# Patient Record
Sex: Female | Born: 1945 | Marital: Married
Health system: Southern US, Community
[De-identification: ages and names within clinical notes are randomized; demographics above are authoritative.]

---

## 2008-10-22 ENCOUNTER — Encounter: Payer: Self-pay | Admitting: Unknown Physician Specialty

## 2008-11-12 ENCOUNTER — Encounter: Payer: Self-pay | Admitting: Unknown Physician Specialty

## 2009-02-04 ENCOUNTER — Ambulatory Visit: Payer: Self-pay | Admitting: Internal Medicine

## 2010-12-16 ENCOUNTER — Ambulatory Visit: Payer: Self-pay

## 2011-04-21 ENCOUNTER — Ambulatory Visit: Payer: Self-pay | Admitting: Physical Medicine and Rehabilitation

## 2011-08-03 ENCOUNTER — Ambulatory Visit: Payer: Self-pay | Admitting: Internal Medicine

## 2011-10-03 ENCOUNTER — Ambulatory Visit: Payer: Self-pay | Admitting: Urology

## 2011-12-26 ENCOUNTER — Ambulatory Visit: Payer: Self-pay | Admitting: Unknown Physician Specialty

## 2012-02-12 ENCOUNTER — Inpatient Hospital Stay: Payer: Self-pay

## 2012-02-12 LAB — PROTIME-INR
INR: 1.2
Prothrombin Time: 15.8 secs — ABNORMAL HIGH (ref 11.5–14.7)

## 2012-02-12 LAB — CBC
HCT: 26.5 % — ABNORMAL LOW (ref 35.0–47.0)
HGB: 8.4 g/dL — ABNORMAL LOW (ref 12.0–16.0)
MCH: 25.1 pg — ABNORMAL LOW (ref 26.0–34.0)
MCHC: 31.8 g/dL — ABNORMAL LOW (ref 32.0–36.0)
MCV: 79 fL — ABNORMAL LOW (ref 80–100)
Platelet: 732 10*3/uL — ABNORMAL HIGH (ref 150–440)
RDW: 16.7 % — ABNORMAL HIGH (ref 11.5–14.5)

## 2012-02-12 LAB — COMPREHENSIVE METABOLIC PANEL
Albumin: 1.9 g/dL — ABNORMAL LOW (ref 3.4–5.0)
Anion Gap: 9 (ref 7–16)
BUN: 30 mg/dL — ABNORMAL HIGH (ref 7–18)
Bilirubin,Total: 2.3 mg/dL — ABNORMAL HIGH (ref 0.2–1.0)
Chloride: 91 mmol/L — ABNORMAL LOW (ref 98–107)
Co2: 24 mmol/L (ref 21–32)
Creatinine: 1.65 mg/dL — ABNORMAL HIGH (ref 0.60–1.30)
EGFR (African American): 37 — ABNORMAL LOW
EGFR (Non-African Amer.): 32 — ABNORMAL LOW
Glucose: 108 mg/dL — ABNORMAL HIGH (ref 65–99)
Osmolality: 256 (ref 275–301)
SGOT(AST): 28 U/L (ref 15–37)
SGPT (ALT): 14 U/L

## 2012-02-12 LAB — DIFFERENTIAL
Eosinophil #: 0.2 10*3/uL (ref 0.0–0.7)
Eosinophil %: 0.6 %
Lymphocyte #: 1.1 10*3/uL (ref 1.0–3.6)
Monocyte #: 1.3 x10 3/mm — ABNORMAL HIGH (ref 0.2–0.9)
Monocyte %: 5.1 %
Neutrophil %: 90 %

## 2012-02-12 LAB — IRON AND TIBC
Iron: 13 ug/dL — ABNORMAL LOW (ref 50–170)
Unbound Iron-Bind.Cap.: 157 ug/dL

## 2012-02-12 LAB — LIPASE, BLOOD: Lipase: 85 U/L (ref 73–393)

## 2012-02-12 LAB — CLOSTRIDIUM DIFFICILE BY PCR

## 2012-02-12 LAB — OCCULT BLOOD X 1 CARD TO LAB, STOOL: Occult Blood, Feces: NEGATIVE

## 2012-02-13 LAB — BASIC METABOLIC PANEL
Calcium, Total: 7.6 mg/dL — ABNORMAL LOW (ref 8.5–10.1)
Chloride: 95 mmol/L — ABNORMAL LOW (ref 98–107)
Co2: 20 mmol/L — ABNORMAL LOW (ref 21–32)
Creatinine: 1.03 mg/dL (ref 0.60–1.30)
EGFR (Non-African Amer.): 57 — ABNORMAL LOW
Glucose: 96 mg/dL (ref 65–99)
Potassium: 3.4 mmol/L — ABNORMAL LOW (ref 3.5–5.1)
Sodium: 130 mmol/L — ABNORMAL LOW (ref 136–145)

## 2012-02-13 LAB — CBC WITH DIFFERENTIAL/PLATELET
Basophil #: 0 10*3/uL (ref 0.0–0.1)
Eosinophil %: 0.7 %
HCT: 24.9 % — ABNORMAL LOW (ref 35.0–47.0)
Lymphocyte #: 0.9 10*3/uL — ABNORMAL LOW (ref 1.0–3.6)
MCH: 26 pg (ref 26.0–34.0)
MCHC: 32.7 g/dL (ref 32.0–36.0)
MCV: 80 fL (ref 80–100)
Monocyte #: 1.4 x10 3/mm — ABNORMAL HIGH (ref 0.2–0.9)
Monocyte %: 5.5 %
Neutrophil #: 22.4 10*3/uL — ABNORMAL HIGH (ref 1.4–6.5)
Neutrophil %: 89.9 %
Platelet: 751 10*3/uL — ABNORMAL HIGH (ref 150–440)
RDW: 17 % — ABNORMAL HIGH (ref 11.5–14.5)
WBC: 24.9 10*3/uL — ABNORMAL HIGH (ref 3.6–11.0)

## 2012-02-13 LAB — HEPATIC FUNCTION PANEL A (ARMC)
Alkaline Phosphatase: 213 U/L — ABNORMAL HIGH (ref 50–136)
Bilirubin,Total: 2 mg/dL — ABNORMAL HIGH (ref 0.2–1.0)
SGOT(AST): 12 U/L — ABNORMAL LOW (ref 15–37)
SGPT (ALT): 14 U/L
Total Protein: 5.8 g/dL — ABNORMAL LOW (ref 6.4–8.2)

## 2012-02-14 LAB — CBC WITH DIFFERENTIAL/PLATELET
Basophil #: 0.1 10*3/uL (ref 0.0–0.1)
Basophil %: 0.3 %
Eosinophil %: 1.6 %
HCT: 22.5 % — ABNORMAL LOW (ref 35.0–47.0)
HGB: 7.3 g/dL — ABNORMAL LOW (ref 12.0–16.0)
Lymphocyte #: 1.4 10*3/uL (ref 1.0–3.6)
Lymphocyte %: 7.2 %
MCHC: 32.4 g/dL (ref 32.0–36.0)
MCV: 80 fL (ref 80–100)
Monocyte #: 1.1 x10 3/mm — ABNORMAL HIGH (ref 0.2–0.9)
Monocyte %: 5.9 %
Neutrophil #: 16.4 10*3/uL — ABNORMAL HIGH (ref 1.4–6.5)
Neutrophil %: 85 %
RBC: 2.8 10*6/uL — ABNORMAL LOW (ref 3.80–5.20)
WBC: 19.3 10*3/uL — ABNORMAL HIGH (ref 3.6–11.0)

## 2012-02-14 LAB — BASIC METABOLIC PANEL
Calcium, Total: 7.3 mg/dL — ABNORMAL LOW (ref 8.5–10.1)
Co2: 22 mmol/L (ref 21–32)
Creatinine: 1.14 mg/dL (ref 0.60–1.30)
EGFR (African American): 58 — ABNORMAL LOW
EGFR (Non-African Amer.): 50 — ABNORMAL LOW
Potassium: 3.9 mmol/L (ref 3.5–5.1)
Sodium: 132 mmol/L — ABNORMAL LOW (ref 136–145)

## 2012-02-14 LAB — MAGNESIUM: Magnesium: 1.4 mg/dL — ABNORMAL LOW

## 2012-02-14 LAB — PHOSPHORUS: Phosphorus: 3.1 mg/dL (ref 2.5–4.9)

## 2012-02-14 LAB — HEMOGLOBIN: HGB: 8.8 g/dL — ABNORMAL LOW (ref 12.0–16.0)

## 2012-02-15 LAB — MAGNESIUM: Magnesium: 1.6 mg/dL — ABNORMAL LOW

## 2012-02-15 LAB — POTASSIUM: Potassium: 3.8 mmol/L (ref 3.5–5.1)

## 2012-02-16 LAB — BASIC METABOLIC PANEL
Anion Gap: 6 — ABNORMAL LOW (ref 7–16)
BUN: 14 mg/dL (ref 7–18)
Chloride: 105 mmol/L (ref 98–107)
Co2: 25 mmol/L (ref 21–32)
Creatinine: 0.86 mg/dL (ref 0.60–1.30)
Glucose: 373 mg/dL — ABNORMAL HIGH (ref 65–99)
Potassium: 3.9 mmol/L (ref 3.5–5.1)
Sodium: 136 mmol/L (ref 136–145)

## 2012-02-16 LAB — CBC WITH DIFFERENTIAL/PLATELET
Eosinophil #: 0.3 10*3/uL (ref 0.0–0.7)
Eosinophil %: 2.4 %
Lymphocyte %: 10.3 %
MCV: 82 fL (ref 80–100)
Monocyte #: 0.8 x10 3/mm (ref 0.2–0.9)
Monocyte %: 7.2 %
Neutrophil #: 9.4 10*3/uL — ABNORMAL HIGH (ref 1.4–6.5)
Neutrophil %: 79.5 %
Platelet: 509 10*3/uL — ABNORMAL HIGH (ref 150–440)
RBC: 3.57 10*6/uL — ABNORMAL LOW (ref 3.80–5.20)
WBC: 11.8 10*3/uL — ABNORMAL HIGH (ref 3.6–11.0)

## 2012-02-16 LAB — MAGNESIUM: Magnesium: 1.3 mg/dL — ABNORMAL LOW

## 2012-02-16 LAB — PHOSPHORUS: Phosphorus: 2.7 mg/dL (ref 2.5–4.9)

## 2012-02-17 LAB — CBC WITH DIFFERENTIAL/PLATELET
Basophil #: 0 10*3/uL (ref 0.0–0.1)
Basophil %: 0.2 %
Eosinophil %: 0.1 %
HCT: 27.1 % — ABNORMAL LOW (ref 35.0–47.0)
Lymphocyte #: 0.8 10*3/uL — ABNORMAL LOW (ref 1.0–3.6)
MCH: 26 pg (ref 26.0–34.0)
MCHC: 32.1 g/dL (ref 32.0–36.0)
MCV: 81 fL (ref 80–100)
Monocyte #: 0.5 x10 3/mm (ref 0.2–0.9)
Monocyte %: 4.6 %
Neutrophil #: 10.3 10*3/uL — ABNORMAL HIGH (ref 1.4–6.5)
Platelet: 410 10*3/uL (ref 150–440)
RBC: 3.34 10*6/uL — ABNORMAL LOW (ref 3.80–5.20)
RDW: 17.4 % — ABNORMAL HIGH (ref 11.5–14.5)

## 2012-02-17 LAB — URINALYSIS, COMPLETE
Glucose,UR: 50 mg/dL (ref 0–75)
Ketone: NEGATIVE
Nitrite: NEGATIVE
Ph: 6 (ref 4.5–8.0)
Protein: 30
RBC,UR: 1188 /HPF (ref 0–5)
WBC UR: 103 /HPF (ref 0–5)

## 2012-02-17 LAB — MAGNESIUM: Magnesium: 2 mg/dL

## 2012-02-17 LAB — BASIC METABOLIC PANEL
BUN: 18 mg/dL (ref 7–18)
Chloride: 108 mmol/L — ABNORMAL HIGH (ref 98–107)
Co2: 27 mmol/L (ref 21–32)
Creatinine: 0.77 mg/dL (ref 0.60–1.30)
Potassium: 3.7 mmol/L (ref 3.5–5.1)
Sodium: 142 mmol/L (ref 136–145)

## 2012-02-18 LAB — CBC WITH DIFFERENTIAL/PLATELET
Basophil %: 0.1 %
Eosinophil #: 0 10*3/uL (ref 0.0–0.7)
Lymphocyte #: 1.4 10*3/uL (ref 1.0–3.6)
MCH: 25.8 pg — ABNORMAL LOW (ref 26.0–34.0)
MCHC: 32.1 g/dL (ref 32.0–36.0)
MCV: 81 fL (ref 80–100)
Monocyte #: 0.7 x10 3/mm (ref 0.2–0.9)
Monocyte %: 5.3 %
Neutrophil #: 11.1 10*3/uL — ABNORMAL HIGH (ref 1.4–6.5)
Neutrophil %: 83.9 %
Platelet: 429 10*3/uL (ref 150–440)
RBC: 3.6 10*6/uL — ABNORMAL LOW (ref 3.80–5.20)
RDW: 17.4 % — ABNORMAL HIGH (ref 11.5–14.5)

## 2012-02-18 LAB — BASIC METABOLIC PANEL
Anion Gap: 7 (ref 7–16)
BUN: 31 mg/dL — ABNORMAL HIGH (ref 7–18)
Calcium, Total: 8.9 mg/dL (ref 8.5–10.1)
EGFR (Non-African Amer.): >60
Glucose: 126 mg/dL — ABNORMAL HIGH (ref 65–99)
Osmolality: 291 (ref 275–301)
Potassium: 4.1 mmol/L (ref 3.5–5.1)

## 2012-02-18 LAB — PHOSPHORUS: Phosphorus: 2.8 mg/dL (ref 2.5–4.9)

## 2012-02-19 LAB — CBC WITH DIFFERENTIAL/PLATELET
Eosinophil #: 0 10*3/uL (ref 0.0–0.7)
Eosinophil %: 0.1 %
HGB: 9.6 g/dL — ABNORMAL LOW (ref 12.0–16.0)
MCH: 26.2 pg (ref 26.0–34.0)
MCHC: 32.3 g/dL (ref 32.0–36.0)
Monocyte #: 0.8 x10 3/mm (ref 0.2–0.9)
Monocyte %: 6.3 %
Neutrophil %: 82.2 %
Platelet: 517 10*3/uL — ABNORMAL HIGH (ref 150–440)
RBC: 3.68 10*6/uL — ABNORMAL LOW (ref 3.80–5.20)
WBC: 12.7 10*3/uL — ABNORMAL HIGH (ref 3.6–11.0)

## 2012-02-19 LAB — URINALYSIS, COMPLETE
Glucose,UR: >=500 mg/dL (ref 0–75)
Hyaline Cast: 1
Nitrite: NEGATIVE
RBC,UR: 173 /HPF (ref 0–5)
WBC UR: 31 /HPF (ref 0–5)

## 2012-02-19 LAB — BASIC METABOLIC PANEL
Anion Gap: 7 (ref 7–16)
BUN: 35 mg/dL — ABNORMAL HIGH (ref 7–18)
Calcium, Total: 8.1 mg/dL — ABNORMAL LOW (ref 8.5–10.1)
Chloride: 110 mmol/L — ABNORMAL HIGH (ref 98–107)
Co2: 26 mmol/L (ref 21–32)
Glucose: 259 mg/dL — ABNORMAL HIGH (ref 65–99)
Osmolality: 302 (ref 275–301)
Potassium: 4.7 mmol/L (ref 3.5–5.1)

## 2012-02-19 LAB — HEMOGLOBIN A1C: Hemoglobin A1C: 7.2 % — ABNORMAL HIGH (ref 4.2–6.3)

## 2012-02-19 LAB — MAGNESIUM: Magnesium: 1.7 mg/dL — ABNORMAL LOW

## 2012-02-20 LAB — BASIC METABOLIC PANEL
Anion Gap: 11 (ref 7–16)
BUN: 31 mg/dL — ABNORMAL HIGH (ref 7–18)
Chloride: 105 mmol/L (ref 98–107)
Co2: 26 mmol/L (ref 21–32)
Creatinine: 0.87 mg/dL (ref 0.60–1.30)
EGFR (African American): >60
EGFR (Non-African Amer.): >60
Glucose: 101 mg/dL — ABNORMAL HIGH (ref 65–99)
Osmolality: 290 (ref 275–301)

## 2012-02-20 LAB — PHOSPHORUS: Phosphorus: 2.7 mg/dL (ref 2.5–4.9)

## 2012-02-20 LAB — ALBUMIN: Albumin: 1.7 g/dL — ABNORMAL LOW (ref 3.4–5.0)

## 2012-02-20 LAB — CALCIUM: Calcium, Total: 7.4 mg/dL — ABNORMAL LOW (ref 8.5–10.1)

## 2012-02-20 LAB — POTASSIUM: Potassium: 3.6 mmol/L (ref 3.5–5.1)

## 2012-02-21 LAB — BASIC METABOLIC PANEL
Anion Gap: 10 (ref 7–16)
BUN: 32 mg/dL — ABNORMAL HIGH (ref 7–18)
Co2: 28 mmol/L (ref 21–32)
EGFR (African American): >60
EGFR (Non-African Amer.): >60
Glucose: 109 mg/dL — ABNORMAL HIGH (ref 65–99)

## 2012-02-21 LAB — CBC WITH DIFFERENTIAL/PLATELET
Basophil %: 0.5 %
Eosinophil %: 0.1 %
HCT: 30.1 % — ABNORMAL LOW (ref 35.0–47.0)
HGB: 9.9 g/dL — ABNORMAL LOW (ref 12.0–16.0)
Lymphocyte #: 1.2 10*3/uL (ref 1.0–3.6)
Lymphocyte %: 7.8 %
MCH: 26.5 pg (ref 26.0–34.0)
MCV: 81 fL (ref 80–100)
Monocyte #: 0.7 x10 3/mm (ref 0.2–0.9)
Monocyte %: 4.4 %
Neutrophil #: 13.4 10*3/uL — ABNORMAL HIGH (ref 1.4–6.5)
Platelet: 351 10*3/uL (ref 150–440)
RBC: 3.73 10*6/uL — ABNORMAL LOW (ref 3.80–5.20)

## 2012-02-21 LAB — POTASSIUM: Potassium: 3.5 mmol/L (ref 3.5–5.1)

## 2012-02-21 LAB — MAGNESIUM: Magnesium: 1.8 mg/dL

## 2012-02-21 LAB — URINE CULTURE

## 2012-02-21 LAB — CALCIUM: Calcium, Total: 7.8 mg/dL — ABNORMAL LOW (ref 8.5–10.1)

## 2012-02-22 LAB — POTASSIUM: Potassium: 4.1 mmol/L (ref 3.5–5.1)

## 2012-02-22 LAB — PHOSPHORUS: Phosphorus: 4.5 mg/dL (ref 2.5–4.9)

## 2012-02-22 LAB — CALCIUM: Calcium, Total: 8 mg/dL — ABNORMAL LOW (ref 8.5–10.1)

## 2012-02-23 LAB — CBC WITH DIFFERENTIAL/PLATELET
Eosinophil #: 0.2 10*3/uL (ref 0.0–0.7)
Eosinophil %: 1.2 %
HCT: 31 % — ABNORMAL LOW (ref 35.0–47.0)
HGB: 9.6 g/dL — ABNORMAL LOW (ref 12.0–16.0)
Lymphocyte #: 0.9 10*3/uL — ABNORMAL LOW (ref 1.0–3.6)
Lymphocyte %: 4.9 %
MCH: 25.6 pg — ABNORMAL LOW (ref 26.0–34.0)
MCV: 83 fL (ref 80–100)
Neutrophil #: 15.6 10*3/uL — ABNORMAL HIGH (ref 1.4–6.5)
Platelet: 464 10*3/uL — ABNORMAL HIGH (ref 150–440)
RBC: 3.74 10*6/uL — ABNORMAL LOW (ref 3.80–5.20)
RDW: 17.8 % — ABNORMAL HIGH (ref 11.5–14.5)
WBC: 17.8 10*3/uL — ABNORMAL HIGH (ref 3.6–11.0)

## 2012-02-23 LAB — PHENYTOIN LEVEL, TOTAL: Dilantin: 9.9 ug/mL — ABNORMAL LOW (ref 10.0–20.0)

## 2012-02-23 LAB — MAGNESIUM: Magnesium: 1.7 mg/dL — ABNORMAL LOW

## 2012-02-23 LAB — POTASSIUM: Potassium: 5 mmol/L (ref 3.5–5.1)

## 2012-02-23 LAB — PHOSPHORUS: Phosphorus: 3 mg/dL (ref 2.5–4.9)

## 2012-02-24 LAB — CBC WITH DIFFERENTIAL/PLATELET
Basophil #: 0 10*3/uL (ref 0.0–0.1)
Eosinophil #: 0 10*3/uL (ref 0.0–0.7)
Eosinophil %: 0.2 %
HCT: 25.5 % — ABNORMAL LOW (ref 35.0–47.0)
HGB: 8.2 g/dL — ABNORMAL LOW (ref 12.0–16.0)
Lymphocyte #: 0.8 10*3/uL — ABNORMAL LOW (ref 1.0–3.6)
Lymphocyte %: 12.3 %
MCH: 26.9 pg (ref 26.0–34.0)
MCHC: 32.3 g/dL (ref 32.0–36.0)
MCV: 83 fL (ref 80–100)
Monocyte #: 0.3 x10 3/mm (ref 0.2–0.9)
Monocyte %: 5.2 %
Neutrophil %: 82.2 %
Platelet: 262 10*3/uL (ref 150–440)
RBC: 3.06 10*6/uL — ABNORMAL LOW (ref 3.80–5.20)
RDW: 18.8 % — ABNORMAL HIGH (ref 11.5–14.5)

## 2012-02-24 LAB — BASIC METABOLIC PANEL
BUN: 20 mg/dL — ABNORMAL HIGH (ref 7–18)
Calcium, Total: 6.8 mg/dL — CL (ref 8.5–10.1)
Chloride: 104 mmol/L (ref 98–107)
Co2: 25 mmol/L (ref 21–32)
Creatinine: 0.66 mg/dL (ref 0.60–1.30)
EGFR (African American): >60
EGFR (Non-African Amer.): >60
Glucose: 280 mg/dL — ABNORMAL HIGH (ref 65–99)
Potassium: 4.6 mmol/L (ref 3.5–5.1)
Sodium: 136 mmol/L (ref 136–145)

## 2012-02-24 LAB — MAGNESIUM: Magnesium: 1.6 mg/dL — ABNORMAL LOW

## 2012-02-24 LAB — VANCOMYCIN, TROUGH: Vancomycin, Trough: 22 ug/mL (ref 10–20)

## 2012-02-25 LAB — CULTURE, BLOOD (SINGLE)

## 2012-02-25 LAB — BASIC METABOLIC PANEL
BUN: 15 mg/dL (ref 7–18)
Chloride: 106 mmol/L (ref 98–107)
Creatinine: 0.64 mg/dL (ref 0.60–1.30)
EGFR (African American): >60
Osmolality: 286 (ref 275–301)
Potassium: 3.9 mmol/L (ref 3.5–5.1)
Sodium: 142 mmol/L (ref 136–145)

## 2012-02-25 LAB — CBC WITH DIFFERENTIAL/PLATELET
Basophil #: 0 10*3/uL (ref 0.0–0.1)
Basophil %: 0.3 %
Eosinophil #: 0.1 10*3/uL (ref 0.0–0.7)
Eosinophil %: 0.6 %
HCT: 31.6 % — ABNORMAL LOW (ref 35.0–47.0)
HGB: 10.2 g/dL — ABNORMAL LOW (ref 12.0–16.0)
Lymphocyte #: 2.2 10*3/uL (ref 1.0–3.6)
MCH: 26.7 pg (ref 26.0–34.0)
MCV: 83 fL (ref 80–100)
Monocyte #: 1 x10 3/mm — ABNORMAL HIGH (ref 0.2–0.9)
Monocyte %: 7.2 %
Neutrophil #: 11.1 10*3/uL — ABNORMAL HIGH (ref 1.4–6.5)
Platelet: 317 10*3/uL (ref 150–440)
RBC: 3.81 10*6/uL (ref 3.80–5.20)
WBC: 14.5 10*3/uL — ABNORMAL HIGH (ref 3.6–11.0)

## 2012-02-25 LAB — PHOSPHORUS: Phosphorus: 2.5 mg/dL (ref 2.5–4.9)

## 2012-02-25 LAB — MAGNESIUM: Magnesium: 2.1 mg/dL

## 2012-02-26 LAB — BASIC METABOLIC PANEL
Anion Gap: 7 (ref 7–16)
BUN: 14 mg/dL (ref 7–18)
Calcium, Total: 7.6 mg/dL — ABNORMAL LOW (ref 8.5–10.1)
Chloride: 105 mmol/L (ref 98–107)
Creatinine: 0.54 mg/dL — ABNORMAL LOW (ref 0.60–1.30)
EGFR (Non-African Amer.): >60
Glucose: 180 mg/dL — ABNORMAL HIGH (ref 65–99)
Osmolality: 288 (ref 275–301)

## 2012-02-26 LAB — CBC WITH DIFFERENTIAL/PLATELET
Basophil %: 0.1 %
Eosinophil %: 1.8 %
HCT: 33.2 % — ABNORMAL LOW (ref 35.0–47.0)
HGB: 10.5 g/dL — ABNORMAL LOW (ref 12.0–16.0)
MCH: 26.3 pg (ref 26.0–34.0)
MCHC: 31.6 g/dL — ABNORMAL LOW (ref 32.0–36.0)
Monocyte #: 1.2 x10 3/mm — ABNORMAL HIGH (ref 0.2–0.9)
Monocyte %: 9 %
Neutrophil #: 9.4 10*3/uL — ABNORMAL HIGH (ref 1.4–6.5)
RDW: 20 % — ABNORMAL HIGH (ref 11.5–14.5)
WBC: 13.4 10*3/uL — ABNORMAL HIGH (ref 3.6–11.0)

## 2012-02-26 LAB — MAGNESIUM: Magnesium: 1.8 mg/dL

## 2012-02-26 LAB — PHOSPHORUS: Phosphorus: 3.1 mg/dL (ref 2.5–4.9)

## 2012-02-27 LAB — CBC WITH DIFFERENTIAL/PLATELET
Basophil #: 0.1 10*3/uL (ref 0.0–0.1)
HCT: 30.7 % — ABNORMAL LOW (ref 35.0–47.0)
Lymphocyte #: 2.3 10*3/uL (ref 1.0–3.6)
MCH: 26.3 pg (ref 26.0–34.0)
MCHC: 31.5 g/dL — ABNORMAL LOW (ref 32.0–36.0)
MCV: 83 fL (ref 80–100)
Monocyte #: 1 x10 3/mm — ABNORMAL HIGH (ref 0.2–0.9)
Monocyte %: 7.6 %
Neutrophil #: 9.5 10*3/uL — ABNORMAL HIGH (ref 1.4–6.5)
Platelet: 341 10*3/uL (ref 150–440)
RDW: 20.9 % — ABNORMAL HIGH (ref 11.5–14.5)

## 2012-02-27 LAB — GLUCOSE, SEROUS FLUID: Glucose, Body Fluid: 127 mg/dL

## 2012-02-27 LAB — PHOSPHORUS: Phosphorus: 3.6 mg/dL (ref 2.5–4.9)

## 2012-02-27 LAB — POTASSIUM: Potassium: 3.6 mmol/L (ref 3.5–5.1)

## 2012-02-27 LAB — PHENYTOIN LEVEL, TOTAL: Dilantin: 7.7 ug/mL — ABNORMAL LOW (ref 10.0–20.0)

## 2012-02-27 LAB — MAGNESIUM: Magnesium: 1.6 mg/dL — ABNORMAL LOW

## 2012-02-27 LAB — PROTIME-INR
INR: 0.9
Prothrombin Time: 12.6 secs (ref 11.5–14.7)

## 2012-02-27 LAB — CALCIUM: Calcium, Total: 7.6 mg/dL — ABNORMAL LOW (ref 8.5–10.1)

## 2012-02-27 LAB — PROTEIN, BODY FLUID: Protein, Body Fluid: 1.3 g/dL

## 2012-02-27 LAB — VANCOMYCIN, TROUGH: Vancomycin, Trough: 11 ug/mL (ref 10–20)

## 2012-02-28 LAB — CBC WITH DIFFERENTIAL/PLATELET
Basophil %: 0.5 %
Eosinophil #: 0.8 10*3/uL — ABNORMAL HIGH (ref 0.0–0.7)
HCT: 32.6 % — ABNORMAL LOW (ref 35.0–47.0)
HGB: 10.5 g/dL — ABNORMAL LOW (ref 12.0–16.0)
Lymphocyte #: 3.6 10*3/uL (ref 1.0–3.6)
Lymphocyte %: 26.1 %
MCH: 26.6 pg (ref 26.0–34.0)
MCHC: 32.1 g/dL (ref 32.0–36.0)
MCV: 83 fL (ref 80–100)
Monocyte #: 1.3 x10 3/mm — ABNORMAL HIGH (ref 0.2–0.9)
Neutrophil #: 8.1 10*3/uL — ABNORMAL HIGH (ref 1.4–6.5)
Neutrophil %: 58.5 %
WBC: 13.8 10*3/uL — ABNORMAL HIGH (ref 3.6–11.0)

## 2012-02-28 LAB — URINALYSIS, COMPLETE
Bilirubin,UR: NEGATIVE
Glucose,UR: NEGATIVE mg/dL (ref 0–75)
Ph: 6 (ref 4.5–8.0)
RBC,UR: 44 /HPF (ref 0–5)
Squamous Epithelial: NONE SEEN
WBC UR: 31 /HPF (ref 0–5)

## 2012-02-28 LAB — BASIC METABOLIC PANEL
BUN: 11 mg/dL (ref 7–18)
Chloride: 99 mmol/L (ref 98–107)
Co2: 28 mmol/L (ref 21–32)
EGFR (African American): >60
Glucose: 81 mg/dL (ref 65–99)
Osmolality: 274 (ref 275–301)
Potassium: 3.2 mmol/L — ABNORMAL LOW (ref 3.5–5.1)
Sodium: 138 mmol/L (ref 136–145)

## 2012-02-28 LAB — PHOSPHORUS: Phosphorus: 3.7 mg/dL (ref 2.5–4.9)

## 2012-02-29 LAB — PHOSPHORUS: Phosphorus: 3.8 mg/dL (ref 2.5–4.9)

## 2012-02-29 LAB — PHENYTOIN LEVEL, TOTAL: Dilantin: 6.9 ug/mL — ABNORMAL LOW (ref 10.0–20.0)

## 2012-02-29 LAB — MAGNESIUM: Magnesium: 1.7 mg/dL — ABNORMAL LOW

## 2012-03-01 LAB — CBC WITH DIFFERENTIAL/PLATELET
Basophil %: 0.3 %
Eosinophil #: 0.3 10*3/uL (ref 0.0–0.7)
Eosinophil %: 2.6 %
HCT: 28.3 % — ABNORMAL LOW (ref 35.0–47.0)
HGB: 9.2 g/dL — ABNORMAL LOW (ref 12.0–16.0)
Lymphocyte #: 1.3 10*3/uL (ref 1.0–3.6)
Lymphocyte %: 10.8 %
MCHC: 32.4 g/dL (ref 32.0–36.0)
MCV: 84 fL (ref 80–100)
Monocyte %: 10.5 %
Neutrophil #: 9.3 10*3/uL — ABNORMAL HIGH (ref 1.4–6.5)
RBC: 3.38 10*6/uL — ABNORMAL LOW (ref 3.80–5.20)

## 2012-03-01 LAB — PHOSPHORUS: Phosphorus: 4.5 mg/dL (ref 2.5–4.9)

## 2012-03-01 LAB — BASIC METABOLIC PANEL
BUN: 21 mg/dL — ABNORMAL HIGH (ref 7–18)
EGFR (African American): >60
EGFR (Non-African Amer.): >60
Glucose: 169 mg/dL — ABNORMAL HIGH (ref 65–99)
Osmolality: 288 (ref 275–301)
Potassium: 3.5 mmol/L (ref 3.5–5.1)
Sodium: 141 mmol/L (ref 136–145)

## 2012-03-01 LAB — MAGNESIUM: Magnesium: 2.3 mg/dL

## 2012-03-01 LAB — LIPASE, BLOOD: Lipase: 155 U/L (ref 73–393)

## 2012-03-02 LAB — CBC WITH DIFFERENTIAL/PLATELET
Basophil %: 0.3 %
Eosinophil #: 0.3 10*3/uL (ref 0.0–0.7)
HGB: 8.5 g/dL — ABNORMAL LOW (ref 12.0–16.0)
Lymphocyte #: 1.7 10*3/uL (ref 1.0–3.6)
Lymphocyte %: 15.6 %
MCH: 27.2 pg (ref 26.0–34.0)
Neutrophil #: 7.5 10*3/uL — ABNORMAL HIGH (ref 1.4–6.5)
Platelet: 319 10*3/uL (ref 150–440)
RBC: 3.11 10*6/uL — ABNORMAL LOW (ref 3.80–5.20)
RDW: 22.9 % — ABNORMAL HIGH (ref 11.5–14.5)

## 2012-03-02 LAB — BASIC METABOLIC PANEL
Anion Gap: 8 (ref 7–16)
BUN: 28 mg/dL — ABNORMAL HIGH (ref 7–18)
Calcium, Total: 8.3 mg/dL — ABNORMAL LOW (ref 8.5–10.1)
EGFR (African American): >60
EGFR (Non-African Amer.): >60
Glucose: 146 mg/dL — ABNORMAL HIGH (ref 65–99)
Osmolality: 284 (ref 275–301)

## 2012-03-02 LAB — VANCOMYCIN, RANDOM: Vancomycin, Random: 35 ug/mL

## 2012-03-02 LAB — MAGNESIUM: Magnesium: 2.3 mg/dL

## 2012-03-02 LAB — VANCOMYCIN, TROUGH: Vancomycin, Trough: 41 ug/mL (ref 10–20)

## 2012-03-02 LAB — PHOSPHORUS: Phosphorus: 5.4 mg/dL — ABNORMAL HIGH (ref 2.5–4.9)

## 2012-03-02 LAB — BODY FLUID CULTURE

## 2012-03-03 LAB — MAGNESIUM: Magnesium: 2.4 mg/dL

## 2012-03-03 LAB — BASIC METABOLIC PANEL
Anion Gap: 9 (ref 7–16)
BUN: 32 mg/dL — ABNORMAL HIGH (ref 7–18)
Chloride: 102 mmol/L (ref 98–107)
Co2: 30 mmol/L (ref 21–32)
EGFR (African American): 59 — ABNORMAL LOW
EGFR (Non-African Amer.): 51 — ABNORMAL LOW
Osmolality: 289 (ref 275–301)

## 2012-03-03 LAB — COMPREHENSIVE METABOLIC PANEL
Albumin: 1.8 g/dL — ABNORMAL LOW (ref 3.4–5.0)
Alkaline Phosphatase: 143 U/L — ABNORMAL HIGH (ref 50–136)
Bilirubin,Total: 0.3 mg/dL (ref 0.2–1.0)
SGOT(AST): 27 U/L (ref 15–37)
SGPT (ALT): 21 U/L
Total Protein: 5.5 g/dL — ABNORMAL LOW (ref 6.4–8.2)

## 2012-03-03 LAB — CBC WITH DIFFERENTIAL/PLATELET
Basophil #: 0.1 10*3/uL (ref 0.0–0.1)
Eosinophil %: 3.4 %
HCT: 26 % — ABNORMAL LOW (ref 35.0–47.0)
HGB: 8.4 g/dL — ABNORMAL LOW (ref 12.0–16.0)
Lymphocyte #: 1.7 10*3/uL (ref 1.0–3.6)
MCH: 27.1 pg (ref 26.0–34.0)
MCV: 84 fL (ref 80–100)
Monocyte #: 1 x10 3/mm — ABNORMAL HIGH (ref 0.2–0.9)
Neutrophil %: 68.6 %
Platelet: 306 10*3/uL (ref 150–440)
RBC: 3.09 10*6/uL — ABNORMAL LOW (ref 3.80–5.20)

## 2012-03-03 LAB — PHOSPHORUS: Phosphorus: 5.5 mg/dL — ABNORMAL HIGH (ref 2.5–4.9)

## 2012-03-04 LAB — BASIC METABOLIC PANEL
Anion Gap: 6 — ABNORMAL LOW (ref 7–16)
BUN: 31 mg/dL — ABNORMAL HIGH (ref 7–18)
Calcium, Total: 8.3 mg/dL — ABNORMAL LOW (ref 8.5–10.1)
Chloride: 100 mmol/L (ref 98–107)
EGFR (African American): 53 — ABNORMAL LOW
EGFR (Non-African Amer.): 46 — ABNORMAL LOW
Glucose: 134 mg/dL — ABNORMAL HIGH (ref 65–99)
Osmolality: 286 (ref 275–301)
Potassium: 3.9 mmol/L (ref 3.5–5.1)

## 2012-03-05 LAB — HEPATIC FUNCTION PANEL A (ARMC)
Albumin: 1.9 g/dL — ABNORMAL LOW (ref 3.4–5.0)
Alkaline Phosphatase: 176 U/L — ABNORMAL HIGH (ref 50–136)
Bilirubin, Direct: 0.1 mg/dL (ref 0.00–0.20)
Bilirubin,Total: 0.3 mg/dL (ref 0.2–1.0)

## 2012-03-05 LAB — CALCIUM: Calcium, Total: 8.4 mg/dL — ABNORMAL LOW (ref 8.5–10.1)

## 2012-03-05 LAB — PHOSPHORUS
Phosphorus: 5 mg/dL — ABNORMAL HIGH (ref 2.5–4.9)
Phosphorus: 5 mg/dL — ABNORMAL HIGH (ref 2.5–4.9)

## 2012-03-05 LAB — LIPASE, BLOOD: Lipase: 128 U/L (ref 73–393)

## 2012-03-05 LAB — MAGNESIUM: Magnesium: 2 mg/dL

## 2012-03-05 LAB — POTASSIUM: Potassium: 3.3 mmol/L — ABNORMAL LOW (ref 3.5–5.1)

## 2012-03-06 LAB — CBC WITH DIFFERENTIAL/PLATELET
Basophil #: 0.1 10*3/uL (ref 0.0–0.1)
Basophil %: 0.8 %
Eosinophil %: 3.6 %
HCT: 28.1 % — ABNORMAL LOW (ref 35.0–47.0)
HGB: 9 g/dL — ABNORMAL LOW (ref 12.0–16.0)
Lymphocyte #: 2.6 10*3/uL (ref 1.0–3.6)
Lymphocyte %: 20.8 %
Monocyte %: 9.6 %
Neutrophil #: 8.3 10*3/uL — ABNORMAL HIGH (ref 1.4–6.5)

## 2012-03-06 LAB — BASIC METABOLIC PANEL
BUN: 25 mg/dL — ABNORMAL HIGH (ref 7–18)
Calcium, Total: 8.6 mg/dL (ref 8.5–10.1)
Chloride: 97 mmol/L — ABNORMAL LOW (ref 98–107)
Creatinine: 1.12 mg/dL (ref 0.60–1.30)
EGFR (African American): 59 — ABNORMAL LOW
Glucose: 84 mg/dL (ref 65–99)
Osmolality: 272 (ref 275–301)
Potassium: 4.3 mmol/L (ref 3.5–5.1)
Sodium: 134 mmol/L — ABNORMAL LOW (ref 136–145)

## 2012-03-06 LAB — ALBUMIN: Albumin: 2 g/dL — ABNORMAL LOW (ref 3.4–5.0)

## 2012-03-06 LAB — PHOSPHORUS: Phosphorus: 4.8 mg/dL (ref 2.5–4.9)

## 2012-03-07 LAB — CALCIUM: Calcium, Total: 8.7 mg/dL (ref 8.5–10.1)

## 2012-03-08 LAB — BASIC METABOLIC PANEL
BUN: 27 mg/dL — ABNORMAL HIGH (ref 7–18)
Chloride: 99 mmol/L (ref 98–107)
Co2: 27 mmol/L (ref 21–32)
Creatinine: 1.2 mg/dL (ref 0.60–1.30)
EGFR (Non-African Amer.): 47 — ABNORMAL LOW
Glucose: 148 mg/dL — ABNORMAL HIGH (ref 65–99)
Potassium: 4.1 mmol/L (ref 3.5–5.1)
Sodium: 135 mmol/L — ABNORMAL LOW (ref 136–145)

## 2012-03-08 LAB — PHOSPHORUS: Phosphorus: 4.6 mg/dL (ref 2.5–4.9)

## 2012-03-09 LAB — BASIC METABOLIC PANEL
Anion Gap: 11 (ref 7–16)
BUN: 28 mg/dL — ABNORMAL HIGH (ref 7–18)
Calcium, Total: 9 mg/dL (ref 8.5–10.1)
Co2: 25 mmol/L (ref 21–32)
Creatinine: 1.18 mg/dL (ref 0.60–1.30)
EGFR (African American): 56 — ABNORMAL LOW
EGFR (Non-African Amer.): 48 — ABNORMAL LOW
Glucose: 150 mg/dL — ABNORMAL HIGH (ref 65–99)

## 2012-03-09 LAB — MAGNESIUM: Magnesium: 2 mg/dL

## 2012-03-09 LAB — PHOSPHORUS: Phosphorus: 4.1 mg/dL (ref 2.5–4.9)

## 2012-03-10 LAB — CALCIUM: Calcium, Total: 8.8 mg/dL (ref 8.5–10.1)

## 2012-03-10 LAB — POTASSIUM: Potassium: 4.4 mmol/L (ref 3.5–5.1)

## 2012-03-10 LAB — MAGNESIUM: Magnesium: 1.9 mg/dL

## 2012-03-11 LAB — MAGNESIUM: Magnesium: 2.1 mg/dL

## 2012-03-11 LAB — BASIC METABOLIC PANEL
Calcium, Total: 9.1 mg/dL (ref 8.5–10.1)
Co2: 24 mmol/L (ref 21–32)
Creatinine: 1.36 mg/dL — ABNORMAL HIGH (ref 0.60–1.30)
EGFR (African American): 47 — ABNORMAL LOW
EGFR (Non-African Amer.): 40 — ABNORMAL LOW
Potassium: 4.8 mmol/L (ref 3.5–5.1)
Sodium: 135 mmol/L — ABNORMAL LOW (ref 136–145)

## 2012-03-12 LAB — CALCIUM: Calcium, Total: 9.2 mg/dL (ref 8.5–10.1)

## 2012-03-13 LAB — PHOSPHORUS: Phosphorus: 3.5 mg/dL (ref 2.5–4.9)

## 2012-03-13 LAB — CALCIUM: Calcium, Total: 9.8 mg/dL (ref 8.5–10.1)

## 2012-03-13 LAB — SODIUM: Sodium: 136 mmol/L (ref 136–145)

## 2012-03-13 LAB — MAGNESIUM: Magnesium: 1.8 mg/dL

## 2012-03-14 ENCOUNTER — Ambulatory Visit: Payer: Self-pay | Admitting: Internal Medicine

## 2012-03-14 LAB — CBC WITH DIFFERENTIAL/PLATELET
Basophil #: 0.1 10*3/uL (ref 0.0–0.1)
Eosinophil #: 0.8 10*3/uL — ABNORMAL HIGH (ref 0.0–0.7)
HCT: 30.9 % — ABNORMAL LOW (ref 35.0–47.0)
HGB: 10.6 g/dL — ABNORMAL LOW (ref 12.0–16.0)
Lymphocyte #: 2.7 10*3/uL (ref 1.0–3.6)
MCH: 28.6 pg (ref 26.0–34.0)
MCHC: 34.3 g/dL (ref 32.0–36.0)
Monocyte #: 0.8 x10 3/mm (ref 0.2–0.9)
Neutrophil %: 55.1 %
Platelet: 488 10*3/uL — ABNORMAL HIGH (ref 150–440)
RBC: 3.7 10*6/uL — ABNORMAL LOW (ref 3.80–5.20)
RDW: 20.8 % — ABNORMAL HIGH (ref 11.5–14.5)
WBC: 9.9 10*3/uL (ref 3.6–11.0)

## 2012-03-14 LAB — COMPREHENSIVE METABOLIC PANEL
Albumin: 2.5 g/dL — ABNORMAL LOW (ref 3.4–5.0)
Anion Gap: 13 (ref 7–16)
BUN: 26 mg/dL — ABNORMAL HIGH (ref 7–18)
Bilirubin,Total: 0.2 mg/dL (ref 0.2–1.0)
Chloride: 101 mmol/L (ref 98–107)
Co2: 20 mmol/L — ABNORMAL LOW (ref 21–32)
EGFR (Non-African Amer.): >60
Glucose: 140 mg/dL — ABNORMAL HIGH (ref 65–99)
Osmolality: 275 (ref 275–301)
Potassium: 4.7 mmol/L (ref 3.5–5.1)
SGOT(AST): 52 U/L — ABNORMAL HIGH (ref 15–37)
Sodium: 134 mmol/L — ABNORMAL LOW (ref 136–145)
Total Protein: 6.3 g/dL — ABNORMAL LOW (ref 6.4–8.2)

## 2012-03-14 LAB — PHOSPHORUS: Phosphorus: 4.2 mg/dL (ref 2.5–4.9)

## 2012-03-15 LAB — BASIC METABOLIC PANEL
Anion Gap: 9 (ref 7–16)
Chloride: 106 mmol/L (ref 98–107)
Co2: 20 mmol/L — ABNORMAL LOW (ref 21–32)
EGFR (African American): 51 — ABNORMAL LOW
Glucose: 163 mg/dL — ABNORMAL HIGH (ref 65–99)
Sodium: 135 mmol/L — ABNORMAL LOW (ref 136–145)

## 2012-03-15 LAB — MAGNESIUM: Magnesium: 1.5 mg/dL — ABNORMAL LOW

## 2012-03-15 LAB — HEPATIC FUNCTION PANEL A (ARMC)
Alkaline Phosphatase: 150 U/L — ABNORMAL HIGH (ref 50–136)
Bilirubin, Direct: <0.1 mg/dL (ref 0.00–0.20)
Bilirubin,Total: 0.2 mg/dL (ref 0.2–1.0)

## 2012-03-16 LAB — COMPREHENSIVE METABOLIC PANEL
Albumin: 2.2 g/dL — ABNORMAL LOW (ref 3.4–5.0)
Alkaline Phosphatase: 193 U/L — ABNORMAL HIGH (ref 50–136)
Anion Gap: 11 (ref 7–16)
BUN: 25 mg/dL — ABNORMAL HIGH (ref 7–18)
Chloride: 106 mmol/L (ref 98–107)
Creatinine: 1.05 mg/dL (ref 0.60–1.30)
EGFR (African American): >60
Glucose: 156 mg/dL — ABNORMAL HIGH (ref 65–99)
Osmolality: 281 (ref 275–301)
Potassium: 4.2 mmol/L (ref 3.5–5.1)
SGOT(AST): 27 U/L (ref 15–37)
SGPT (ALT): 32 U/L (ref 12–78)
Total Protein: 6.8 g/dL (ref 6.4–8.2)

## 2012-03-16 LAB — CBC WITH DIFFERENTIAL/PLATELET
Basophil #: 0.1 10*3/uL (ref 0.0–0.1)
Eosinophil #: 0.9 10*3/uL — ABNORMAL HIGH (ref 0.0–0.7)
HCT: 31.1 % — ABNORMAL LOW (ref 35.0–47.0)
Lymphocyte %: 25.5 %
MCHC: 32.8 g/dL (ref 32.0–36.0)
Monocyte #: 0.9 x10 3/mm (ref 0.2–0.9)
Monocyte %: 8.9 %
Neutrophil #: 5.8 10*3/uL (ref 1.4–6.5)
RBC: 3.72 10*6/uL — ABNORMAL LOW (ref 3.80–5.20)
RDW: 20.9 % — ABNORMAL HIGH (ref 11.5–14.5)
WBC: 10.3 10*3/uL (ref 3.6–11.0)

## 2012-03-16 LAB — MAGNESIUM: Magnesium: 1.9 mg/dL

## 2012-03-17 LAB — CALCIUM: Calcium, Total: 9.6 mg/dL (ref 8.5–10.1)

## 2012-03-17 LAB — PHOSPHORUS: Phosphorus: 3.6 mg/dL (ref 2.5–4.9)

## 2012-03-17 LAB — MAGNESIUM: Magnesium: 1.9 mg/dL

## 2012-03-18 LAB — CBC WITH DIFFERENTIAL/PLATELET
Basophil #: 0.1 10*3/uL (ref 0.0–0.1)
Eosinophil #: 1 10*3/uL — ABNORMAL HIGH (ref 0.0–0.7)
HCT: 30.2 % — ABNORMAL LOW (ref 35.0–47.0)
Lymphocyte #: 2.7 10*3/uL (ref 1.0–3.6)
Lymphocyte %: 25.7 %
MCHC: 33.3 g/dL (ref 32.0–36.0)
MCV: 85 fL (ref 80–100)
Monocyte #: 1 x10 3/mm — ABNORMAL HIGH (ref 0.2–0.9)
Monocyte %: 9.4 %
Neutrophil #: 5.7 10*3/uL (ref 1.4–6.5)
Platelet: 537 10*3/uL — ABNORMAL HIGH (ref 150–440)
RDW: 20.8 % — ABNORMAL HIGH (ref 11.5–14.5)

## 2012-03-18 LAB — MAGNESIUM: Magnesium: 2 mg/dL

## 2012-03-18 LAB — COMPREHENSIVE METABOLIC PANEL
Albumin: 2.3 g/dL — ABNORMAL LOW (ref 3.4–5.0)
Anion Gap: 10 (ref 7–16)
BUN: 33 mg/dL — ABNORMAL HIGH (ref 7–18)
Bilirubin,Total: 0.2 mg/dL (ref 0.2–1.0)
Calcium, Total: 9.6 mg/dL (ref 8.5–10.1)
Chloride: 109 mmol/L — ABNORMAL HIGH (ref 98–107)
EGFR (African American): >60
Glucose: 172 mg/dL — ABNORMAL HIGH (ref 65–99)
Potassium: 4.4 mmol/L (ref 3.5–5.1)
SGOT(AST): 16 U/L (ref 15–37)
SGPT (ALT): 23 U/L (ref 12–78)
Sodium: 138 mmol/L (ref 136–145)

## 2012-03-18 LAB — PHOSPHORUS: Phosphorus: 4 mg/dL (ref 2.5–4.9)

## 2012-03-19 DIAGNOSIS — I4891 Unspecified atrial fibrillation: Secondary | ICD-10-CM

## 2012-03-19 LAB — CBC WITH DIFFERENTIAL/PLATELET
Basophil #: 0 10*3/uL (ref 0.0–0.1)
Basophil %: 0.5 %
Eosinophil #: 0.5 10*3/uL (ref 0.0–0.7)
Eosinophil %: 7.5 %
HGB: 8.2 g/dL — ABNORMAL LOW (ref 12.0–16.0)
Lymphocyte %: 26.8 %
MCHC: 33.6 g/dL (ref 32.0–36.0)
Monocyte %: 9.7 %
Neutrophil #: 3.9 10*3/uL (ref 1.4–6.5)
Neutrophil %: 55.5 %
RBC: 2.93 10*6/uL — ABNORMAL LOW (ref 3.80–5.20)
RDW: 20 % — ABNORMAL HIGH (ref 11.5–14.5)
WBC: 7.1 10*3/uL (ref 3.6–11.0)

## 2012-03-19 LAB — BASIC METABOLIC PANEL
Anion Gap: 6 — ABNORMAL LOW (ref 7–16)
Anion Gap: 9 (ref 7–16)
BUN: 21 mg/dL — ABNORMAL HIGH (ref 7–18)
Calcium, Total: 8.8 mg/dL (ref 8.5–10.1)
Chloride: 101 mmol/L (ref 98–107)
Co2: 29 mmol/L (ref 21–32)
EGFR (Non-African Amer.): >60
Glucose: 936 mg/dL (ref 65–99)
Glucose: 130 mg/dL — ABNORMAL HIGH (ref 65–99)
Osmolality: 321 (ref 275–301)
Osmolality: 288 (ref 275–301)
Potassium: 4 mmol/L (ref 3.5–5.1)
Sodium: 136 mmol/L (ref 136–145)

## 2012-03-19 LAB — PHOSPHORUS
Phosphorus: 2.6 mg/dL (ref 2.5–4.9)
Phosphorus: 2.9 mg/dL (ref 2.5–4.9)

## 2012-03-19 LAB — MAGNESIUM
Magnesium: 2.4 mg/dL
Magnesium: 1.6 mg/dL — ABNORMAL LOW

## 2012-03-20 DIAGNOSIS — J984 Other disorders of lung: Secondary | ICD-10-CM

## 2012-03-20 DIAGNOSIS — I495 Sick sinus syndrome: Secondary | ICD-10-CM

## 2012-03-20 LAB — PHOSPHORUS: Phosphorus: 3.2 mg/dL (ref 2.5–4.9)

## 2012-03-20 LAB — BASIC METABOLIC PANEL
Anion Gap: 6 — ABNORMAL LOW (ref 7–16)
BUN: 22 mg/dL — ABNORMAL HIGH (ref 7–18)
Calcium, Total: 8.9 mg/dL (ref 8.5–10.1)
Chloride: 105 mmol/L (ref 98–107)
Co2: 31 mmol/L (ref 21–32)
EGFR (African American): >60
Osmolality: 290 (ref 275–301)
Potassium: 3.5 mmol/L (ref 3.5–5.1)

## 2012-03-20 LAB — MAGNESIUM: Magnesium: 2.1 mg/dL

## 2012-03-21 LAB — CBC WITH DIFFERENTIAL/PLATELET
Basophil #: 0 x10 3/mm 3
Basophil %: 0.6 %
Eosinophil #: 0.6 x10 3/mm 3
Eosinophil %: 8.4 %
HCT: 26.4 % — ABNORMAL LOW
HGB: 8.9 g/dL — ABNORMAL LOW
Lymphocyte %: 25.8 %
Lymphs Abs: 1.9 x10 3/mm 3
MCH: 28.3 pg
MCHC: 33.8 g/dL
MCV: 84 fL
Monocyte #: 0.7 "x10 3/mm "
Monocyte %: 9.2 %
Neutrophil #: 4.1 x10 3/mm 3
Neutrophil %: 56 %
Platelet: 413 x10 3/mm 3
RBC: 3.16 X10 6/mm 3 — ABNORMAL LOW
RDW: 20.4 % — ABNORMAL HIGH
WBC: 7.4 x10 3/mm 3

## 2012-03-21 LAB — CALCIUM: Calcium, Total: 9.2 mg/dL (ref 8.5–10.1)

## 2012-03-21 LAB — BASIC METABOLIC PANEL WITH GFR
Anion Gap: 5 — ABNORMAL LOW
BUN: 19 mg/dL — ABNORMAL HIGH
Chloride: 104 mmol/L
Co2: 31 mmol/L
Creatinine: 0.67 mg/dL
EGFR (African American): >60
EGFR (Non-African Amer.): >60
Glucose: 158 mg/dL — ABNORMAL HIGH
Osmolality: 285
Sodium: 140 mmol/L

## 2012-03-21 LAB — MAGNESIUM: Magnesium: 1.8 mg/dL

## 2012-03-21 LAB — PHOSPHORUS: Phosphorus: 3.1 mg/dL (ref 2.5–4.9)

## 2012-03-22 LAB — MAGNESIUM: Magnesium: 1.7 mg/dL — ABNORMAL LOW

## 2012-03-22 LAB — POTASSIUM: Potassium: 3.5 mmol/L (ref 3.5–5.1)

## 2012-03-22 LAB — PHOSPHORUS: Phosphorus: 3.8 mg/dL (ref 2.5–4.9)

## 2012-03-25 ENCOUNTER — Telehealth: Payer: Self-pay

## 2012-03-25 NOTE — Telephone Encounter (Signed)
I called Shannon Willis to find out how she is doing post hospital.  The number listed was disconnected.  No other number listed at this clinic or at [facility].  Unable to reach pt to find out how she is doing.

## 2012-04-14 ENCOUNTER — Ambulatory Visit: Payer: Self-pay | Admitting: Internal Medicine

## 2012-04-14 DEATH — deceased

## 2012-11-08 IMAGING — CT CT ABD-PELV W/O CM
1 of 2 series · 14 of 32 positions shown, 18 images · non-contrast
Comparison: none

REASON FOR EXAM: (1) nv/pain; (2) fistula;    NOTE: Nursing to Give Oral
CT Contrast
COMMENTS:   LMP: Post-Menopausal

PROCEDURE:     CT  - CT ABDOMEN AND PELVIS W[DATE] [DATE]
RESULT:     Comparison: CT of the pelvis 02/27/2012
TECHNIQUE: Multiple axial images from the lung bases to the symphysis pubis
were obtained without oral and without intravenous contrast. The patient
refused oral contrast material.

[Series 2: 3mm soft tissue · axial · 0.98mm/px · z∈[-1059,-612]mm · 14 of 163 slices shown, 18 images]
[im 7/163  soft-tissue]
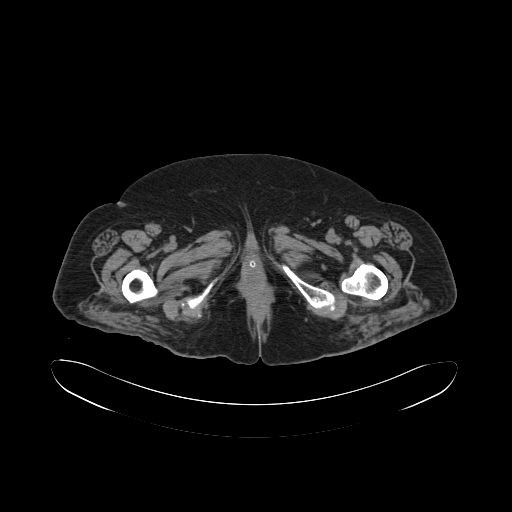
[im 7/163  bone]
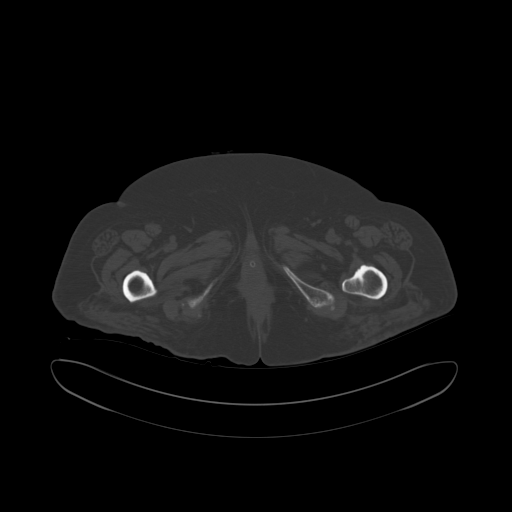
[im 21/163  soft-tissue]
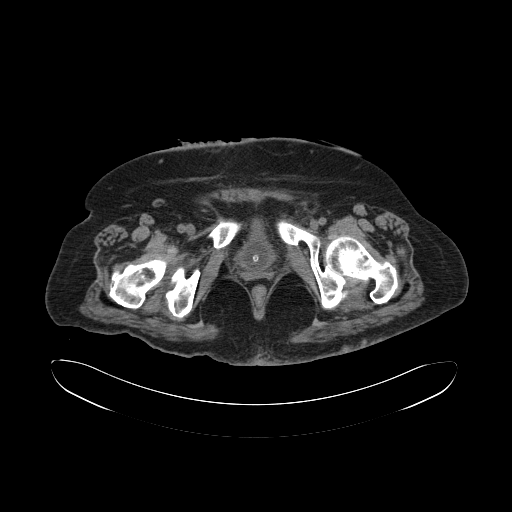
[im 34/163  soft-tissue]
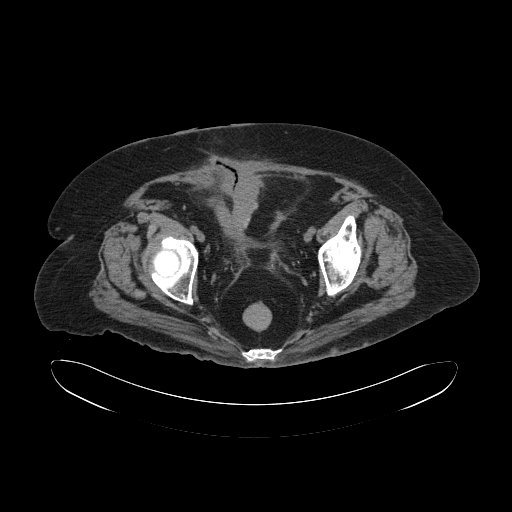
[im 48/163  soft-tissue]
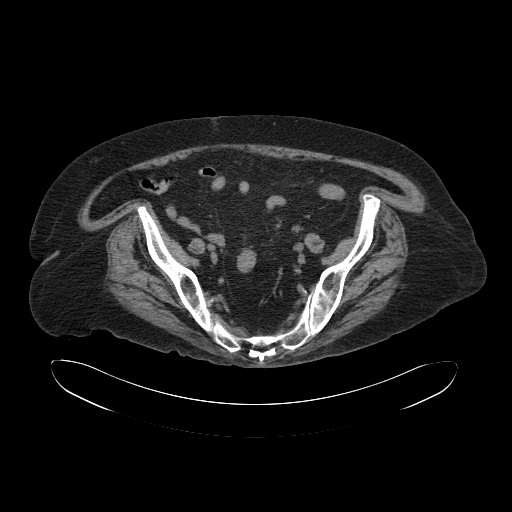
[im 61/163  soft-tissue]
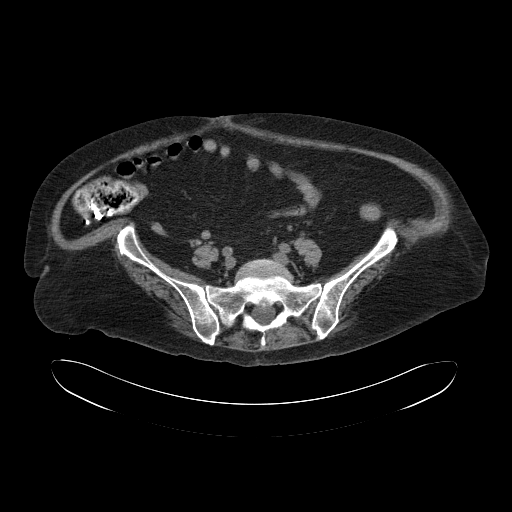
[im 75/163  soft-tissue]
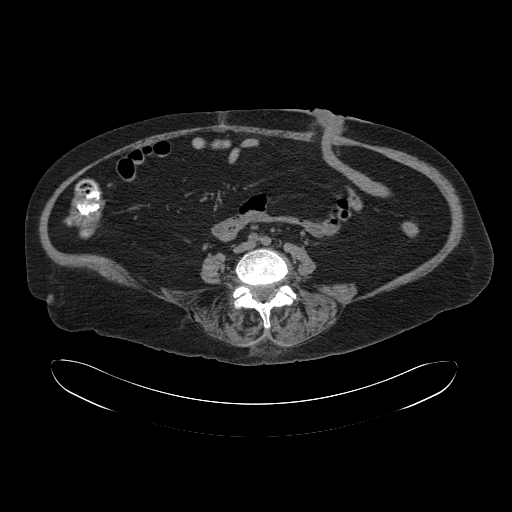
[im 88/163  soft-tissue]
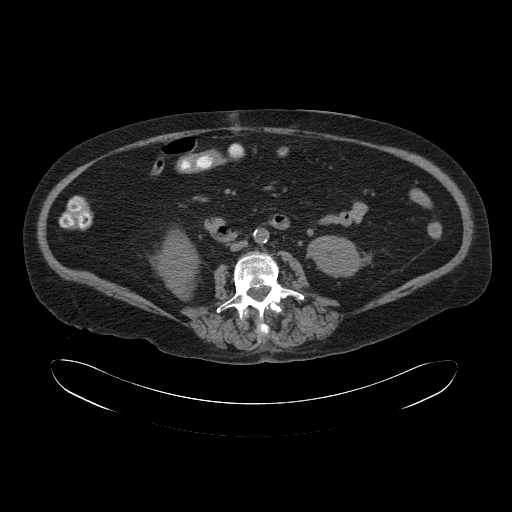
[im 102/163  soft-tissue]
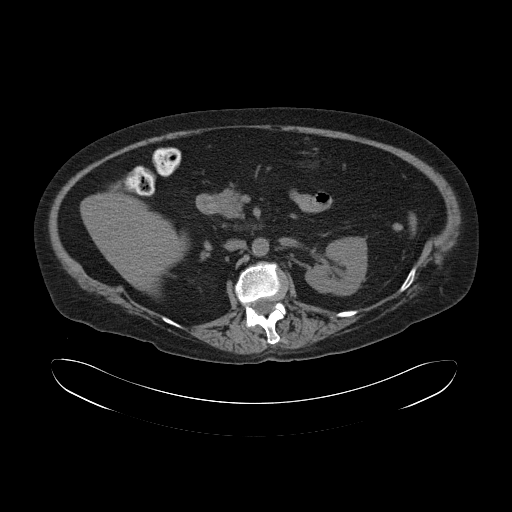
[im 115/163  soft-tissue]
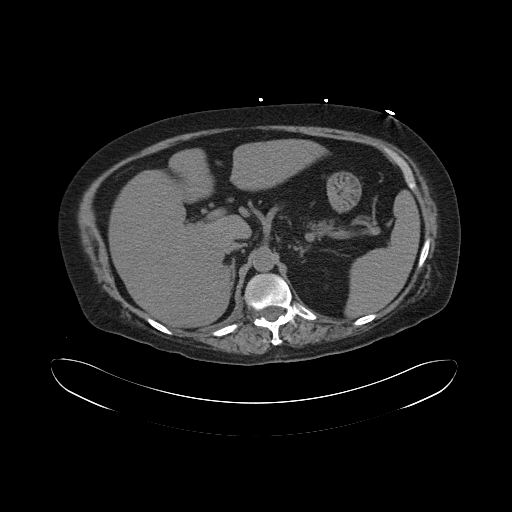
[im 115/163  bone]
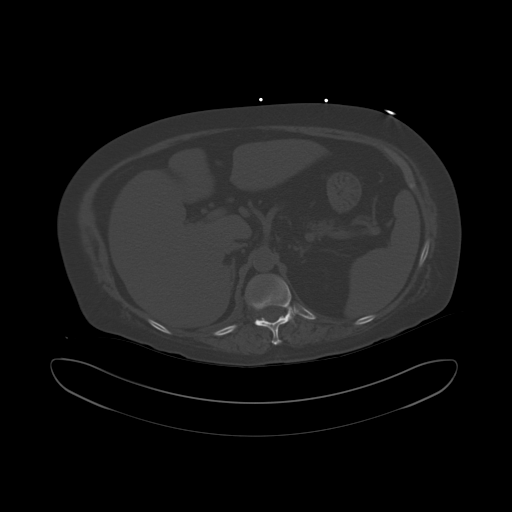
[im 129/163  soft-tissue]
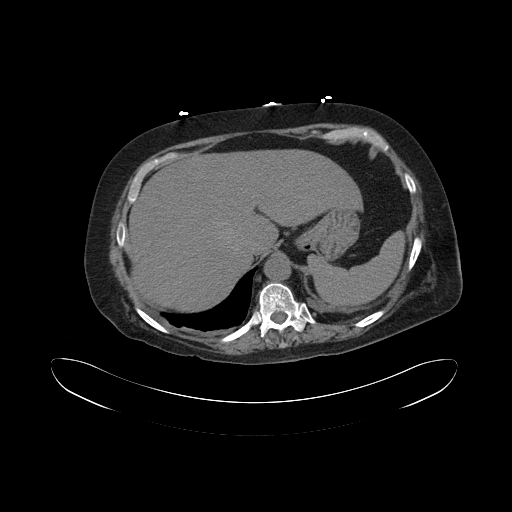
[im 136/163  lung]
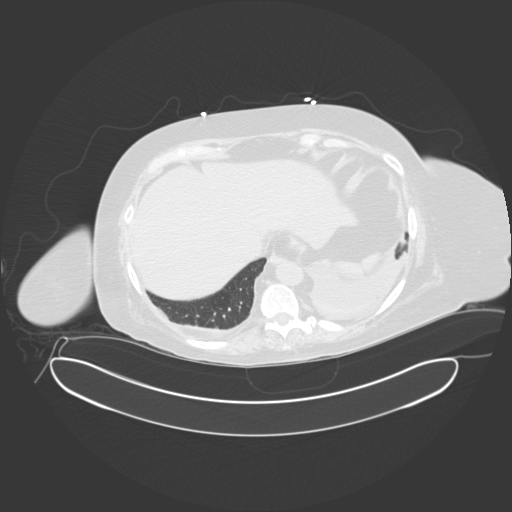
[im 142/163  soft-tissue]
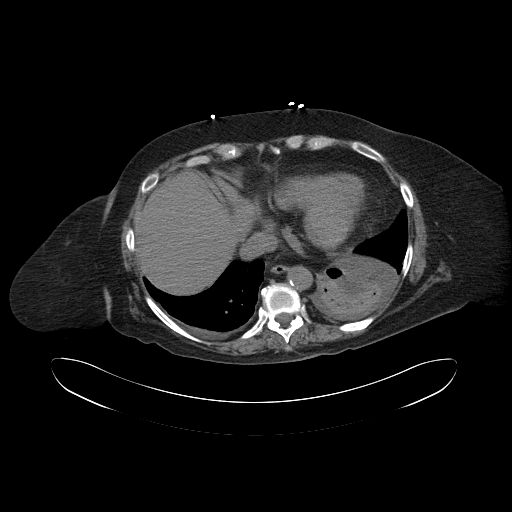
[im 142/163  lung]
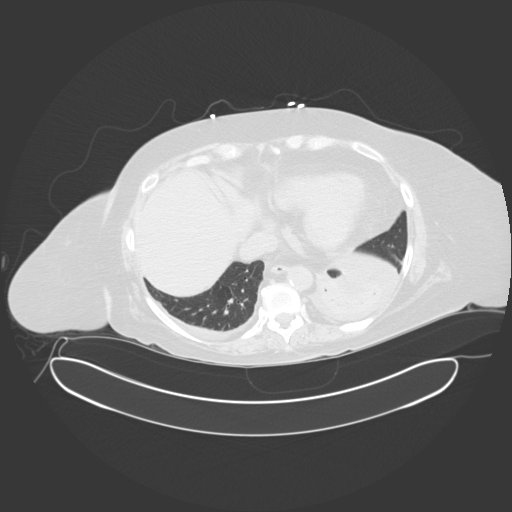
[im 149/163  lung]
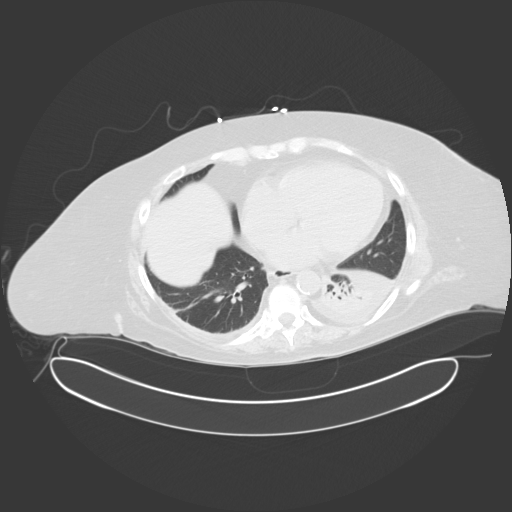
[im 156/163  soft-tissue]
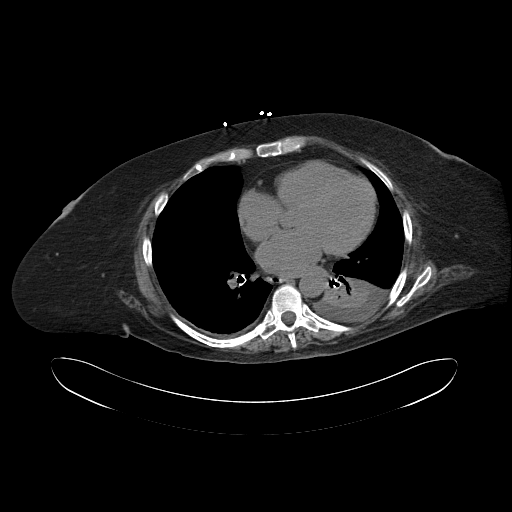
[im 156/163  lung]
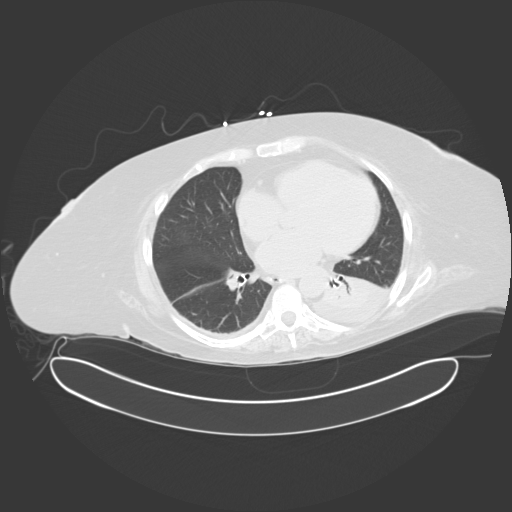

[14 of 32 positions shown; findings below may reference images not displayed]

FINDINGS: Mild right basilar opacities are likely secondary to atelectasis. There is a
small left pleural effusion. Consolidation and volume loss in the left lower
lobe is likely secondary to atelectasis. Infection is not excluded.

Lack of intravenous contrast limits evaluation of the solid abdominal
organs.  Grossly, the liver, spleen, adrenals, and pancreas are
unremarkable. Surgical clips are seen from prior cholecystectomy. No renal
calculi or hydronephrosis.

The small and large bowel are normal in caliber. There is a double barrel
ostomy in the left hemiabdomen. The patient is status post hysterectomy. A
Foley catheter is present within the bladder. There is a large air filled
tract extending from the skin into the anterior pelvis. There is soft tissue
thickening in this region which extends to the bladder. This likely
correlates with the fistulous connection to the bladder seen on the recent
prior CT. This tract is immediately adjacent to several loops of small bowel
as well of the sigmoid colon. The soft tissue density is inseparable from
some of these loops of bowel. A fistulous connection with the bowel cannot
be excluded in addition to the bladder fistula. There is mild stranding
within the fat of the anterior pelvis, which is nonspecific. There is some
residual oral contrast material within the hepatic flexure of the colon.
There are no drainable fluid collections.

No aggressive lytic or sclerotic osseous lesions are identified.
IMPRESSION: 1. There are findings in the anterior pelvis which correlate with the
recently demonstrated fistula from the anterior pelvic wall to the bladder.
The soft tissue density along this fistulous tract is immediately adjacent
and inseparable from the adjacent sigmoid colon as well as some loops of
small bowel. An additional fistulous connection to the bowel cannot be
excluded by this study. The patient refused oral contrast material.
2. No drainable fluid collection.
3. Volume loss and consolidation in the left lower lobe is likely secondary
to atelectasis. Infection is not excluded.

## 2014-12-01 NOTE — Consult Note (Signed)
Some nausea, some po intake todayUOP, clear yellow at presentbillous color but will be expected with fistulaif neededneed eventual repairfor now.  Electronic Signatures: Smith Robertope, Nykerria Macconnell S (MD)  (Signed on 23-Jul-13 12:12)  Authored  Last Updated: 23-Jul-13 12:12 by Smith Robertope, Broady Lafoy S (MD)

## 2014-12-01 NOTE — Consult Note (Signed)
Brief Consult Note: Diagnosis: diarrhea.   Patient was seen by consultant.   Consult note dictated.   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 There was also concern over partial small bowel obstruction on intial CT. GI has been consulted by Dr Sampson GoonFitzgerald for evaluation of NV. Patient is very lethargic at present, unable to obtain ROS. VSS, fingerstick 152. Evidentally she has been having problems with nausea and vomiting despite antiemetics. Noted no relief per chart review after Reglan changed to standing order. Zofran remains prn. No current complaints of NV.  Impression and plan: NV- differentials include SBO, antibiotic side effects, others. NV in light of prior concern of partial sbo. Abdomen nondistended, nontender.  Very little drainage from colostomy, large amount of drainage from drain site: yellowish green.  Will obtain imaging of small bowel Would also make NPO for now, as unable to tolerate most po's.  Also discussed patient with Dr Sampson GoonFitzgerald in light of her neuro status- evidentally this has waxed and waned during admission. FS now 152, 113/52, hr 64, rr 18, Sao2 94% ra. Is being followed by psych as well for medication adjustments related to depression/increased home use of BZDs..  Electronic Signatures: Vevelyn PatLondon, Christiane H (NP)  (Signed [phone]-Aug-13 14:48)  Authored: Brief Consult Note   Last Updated: 02-Aug-13 14:48 by Keturah BarreLondon, Christiane H (NP)
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
## 2014-12-01 NOTE — Consult Note (Signed)
Chief Complaint:   Subjective/Chief Complaint following  s/p abdominal abscess drainage. more alert today, denies nausea.  one episode of emesis today after a coughing spell, not related to nausea.  denies abdominal pain.   VITAL SIGNS/ANCILLARY NOTES: **Vital Signs.:   09-Aug-13 17:07   Vital Signs Type Routine   Temperature Source oral   Pulse Pulse 104   Pulse source if not from Vital Sign Device per cardiac monitor   Respirations Respirations 17   Pulse Ox % Pulse Ox % 93   Oxygen Delivery 1L; Nasal Cannula   Pulse Ox Heart Rate 102   Telemetry pattern Cardiac Rhythm Normal sinus rhythm; pattern reported by Telemetry Clerk  *Intake and Output.:   Daily 09-Aug-13 07:00   Penrose Drain ml     Out:  50    17:00   Penrose Drain ml     Out:  50    Shift 23:00   Penrose Drain ml     Out:  50   Brief Assessment:   Cardiac Regular    Respiratory clear BS    Gastrointestinal details normal Soft  Nontender  Nondistended  Bowel sounds normal   Lab Results: Routine Chem:  09-Aug-13 09:15    Potassium, Serum 3.5 (Result(s) reported on 22 Mar 2012 at 09:38AM.)   Phosphorus, Serum 3.8 (Result(s) reported on 22 Mar 2012 at 09:38AM.)   Calcium (Total), Serum 9.3 (Result(s) reported on 22 Mar 2012 at 09:38AM.)   Magnesium, Serum  1.7 (1.8-2.4 THERAPEUTIC RANGE: 4-7 mg/dL TOXIC: > 10 mg/dL  -----------------------)   Assessment/Plan:  Assessment/Plan:   Assessment 1) abdominal abscess s/p drainage, with colovesicle fistula, diverting colostomy, enterocutaneous fistula.  Change of foley noted, with approvement of local swelling.  enterocutaneous fistula seems also to be decreasing in amount of drainage.  2) n/v-minimal, likely resolved.  On clears only po rather than other diet to decrease intake and help with closure of fistula (manaed with TPN/abx/minimal po). patient not currently on abx, may want to start metronidazole 250 mg iv qid, to assist wiht # 1, but may affect c/o  nausea.    Plan 1) as above, Dr Mechele CollinElliott covering this weekend.   Electronic Signatures: Barnetta ChapelSkulskie, Martin (MD)  (Signed 09-Aug-13 18:51)  Authored: Chief Complaint, VITAL SIGNS/ANCILLARY NOTES, Brief Assessment, Lab Results, Assessment/Plan   Last Updated: 09-Aug-13 18:51 by Barnetta ChapelSkulskie, Martin (MD)

## 2014-12-01 NOTE — Consult Note (Signed)
I was asked by Dr. Katrinka BlazingSmith to check in with patient as to dose of Sandostatin for desired effect of decreasing flow from fistula.  Dose started at 100ug every 6 hours. Advancing diet to full liquids.  Will follow with you and increase dose if tolerated.   Electronic Signatures: Scot JunElliott, Tanysha Quant T (MD)  (Signed on 24-Jul-13 18:20)  Authored  Last Updated: 24-Jul-13 18:20 by Scot JunElliott, Skylah Delauter T (MD)

## 2014-12-01 NOTE — Consult Note (Signed)
Chief Complaint:   Subjective/Chief Complaint a little more alert this evening, states some lower abdominal discomfort. denies nausea.   VITAL SIGNS/ANCILLARY NOTES: **Vital Signs.:   06-Aug-13 21:00   Vital Signs Type Routine   Pulse Pulse 76   Respirations Respirations 24   Systolic BP Systolic BP 060   Diastolic BP (mmHg) Diastolic BP (mmHg) 55   Mean BP 96   Pulse Ox % Pulse Ox % 100   Oxygen Delivery HF 34%   Pulse Ox Heart Rate 76   Telemetry pattern Cardiac Rhythm Atrial fibrillation; Atrial flutter; pattern reported by Telemetry Clerk  *Intake and Output.:   Daily 06-Aug-13 07:00   Penrose Drain ml     Out:  75   Brief Assessment:   Cardiac Regular    Respiratory clear BS    Gastrointestinal details normal Soft  Nondistended  Bowel sounds normal  vac drain in place over enterocutaneous fistula opening. mild rlq tender   Lab Results: Lab:  06-Aug-13 11:40    pH (ABG)  7.37   PCO2  55   PO2  114   FiO2 34   Base Excess  5.0   HCO3  31.8   O2 Saturation 98.2   O2 Device HFNC   Specimen Site (ABG) RT RADIAL   Specimen Type (ABG) ARTERIAL   Patient Temp (ABG) 37.0 (Result(s) reported on 19 Mar 2012 at 11:56AM.)   Lactic Acid, Cardiopulmonary 1.2 (Result(s) reported on 19 Mar 2012 at 11:56AM.)  Routine Chem:  06-Aug-13 10:53    Magnesium, Serum - (1.8-2.4 THERAPEUTIC RANGE: 4-7 mg/dL TOXIC: > 10 mg/dL  -----------------------)   Magnesium, Serum 2.4 (1.8-2.4 THERAPEUTIC RANGE: 4-7 mg/dL TOXIC: > 10 mg/dL  -----------------------)   Phosphorus, Serum -   Phosphorus, Serum 2.6 (Result(s) reported on 19 Mar 2012 at 12:02PM.)   Glucose, Serum  936   BUN  21   Creatinine (comp) 0.99   Sodium, Serum 136   Potassium, Serum 4.0   Chloride, Serum 101   CO2, Serum 29   Calcium (Total), Serum  8.2   Anion Gap  6   Osmolality (calc) 321   eGFR (African American) >60   eGFR (Non-African American)  59 (eGFR values <30m/min/1.73 m2 may be an indication of  chronic kidney disease (CKD). Calculated eGFR is useful in patients with stable renal function. The eGFR calculation will not be reliable in acutely ill patients when serum creatinine is changing rapidly. It is not useful in  patients on dialysis. The eGFR calculation may not be applicable to patients at the low and high extremes of body sizes, pregnant women, and vegetarians.)   Result Comment mg/phos - dupl  Result(s) reported on 19 Mar 2012 at 11:57AM.   Result Comment cbc - redraw requested.  Result(s) reported on 19 Mar 2012 at 04:12PM.   Result Comment GLUCOSE - RESULTS VERIFIED BY REPEAT TESTING.  - NOTIFIED OF CRITICAL VALUE  - CALLED TO BFordyceAT 10459 - BY GA ON 03/19/12  - READ-BACK PROCESS PERFORMED.  Result(s) reported on 19 Mar 2012 at 04:13PM.    13:00    Magnesium, Serum  1.6 (1.8-2.4 THERAPEUTIC RANGE: 4-7 mg/dL TOXIC: > 10 mg/dL  -----------------------)   Phosphorus, Serum 2.9 (Result(s) reported on 19 Mar 2012 at 02:04PM.)   Glucose, Serum  130   BUN  21   Creatinine (comp) 0.69   Sodium, Serum 142   Potassium, Serum  3.4   Chloride, Serum 103   CO2, Serum  30   Calcium (Total), Serum 8.8   Anion Gap 9   Osmolality (calc) 288   eGFR (African American) >60   eGFR (Non-African American) >60 (eGFR values <29m/min/1.73 m2 may be an indication of chronic kidney disease (CKD). Calculated eGFR is useful in patients with stable renal function. The eGFR calculation will not be reliable in acutely ill patients when serum creatinine is changing rapidly. It is not useful in  patients on dialysis. The eGFR calculation may not be applicable to patients at the low and high extremes of body sizes, pregnant women, and vegetarians.)  Routine Hem:  06-Aug-13 10:53    WBC (CBC) -   RBC (CBC) -   Hemoglobin (CBC) -   Hematocrit (CBC) -   Platelet Count (CBC) -   MCV -   MCH -   MCHC -   RDW -   Neutrophil % -   Lymphocyte % -   Monocyte % -   Eosinophil % -    Basophil % -   Neutrophil # -   Lymphocyte # -   Monocyte # -   Eosinophil # -   Basophil # -   Bands -   Segmented Neutrophils -   Lymphocytes -   Variant Lymphocytes -   Monocytes -   Eosinophil -   Basophil -   Metamyelocyte -   Myelocyte -   Promyelocyte -   Blast-Like -   Other Cells -   NRBC -   Diff Comment 1 -   Diff Comment 2 -   Diff Comment 3 -   Diff Comment 4 -   Diff Comment 5 -   Diff Comment 6 -   Diff Comment 7 -   Diff Comment 8 -   Diff Comment 9 -   Diff Comment 10 - (Result(s) reported on 19 Mar 2012 at 04:12PM.)    15:33    WBC (CBC) 7.1   RBC (CBC)  2.93   Hemoglobin (CBC)  8.2   Hematocrit (CBC)  24.4   Platelet Count (CBC) 386   MCV 83   MCH 28.0   MCHC 33.6   RDW  20.0   Neutrophil % 55.5   Lymphocyte % 26.8   Monocyte % 9.7   Eosinophil % 7.5   Basophil % 0.5   Neutrophil # 3.9   Lymphocyte # 1.9   Monocyte # 0.7   Eosinophil # 0.5   Basophil # 0.0 (Result(s) reported on 19 Mar 2012 at 04:20PM.)   Assessment/Plan:  Assessment/Plan:   Assessment 1) 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  2) colostomy, low output, some fluid today.    Plan 1) continue current, on H2RA ulcer prophylaxis.   Electronic Signatures: SLoistine Simas(MD)  (Signed 06-Aug-13 21:48)  Authored: Chief Complaint, VITAL SIGNS/ANCILLARY NOTES, Brief Assessment, Lab Results, Assessment/Plan   Last Updated: 06-Aug-13 21:48 by SLoistine Simas(MD)

## 2014-12-01 NOTE — Consult Note (Signed)
It has come to my attention that the original Foley catheter had been removed and changed.  A 16-French regular catheter was in place.  Under ABSOLUTELY no circumstances should a small catheter be placed.  With the fistula, debris will be present which can easily occluded catheter.  This will significantly increase the output from the fistula.  She should have a minimum size 22-French Bard catheter in place for proper bladder drainage and irrigation if needed.  It is okay to change if needed.  The blue catheters are too thin and flimsy to adequately irrigate if needed.  They also tend to kink much more easily.  This will also increase fistula output drainage.  This is the reason for the Bard catheter.  If there are any issues with the catheter, please contact myself or information.  Will continue to follow.  Electronic Signatures: Smith Robertope, Javani Spratt S (MD)  (Signed on 09-Aug-13 08:46)  Authored  Last Updated: 09-Aug-13 08:46 by Smith Robertope, Nastasha Reising S (MD)

## 2014-12-01 NOTE — Consult Note (Signed)
Chief Complaint:   Subjective/Chief Complaint patient denies nausea today, no emesis recorded.  denies abdominal pain.   VITAL SIGNS/ANCILLARY NOTES: **Vital Signs.:   03-Aug-13 11:34   Vital Signs Type Routine   Temperature Temperature (F) 97.7   Celsius 36.5   Temperature Source Oral   Pulse Pulse 73   Respirations Respirations 18   Systolic BP Systolic BP 709   Diastolic BP (mmHg) Diastolic BP (mmHg) 65   Mean BP 86   Pulse Ox % Pulse Ox % 96   Pulse Ox Activity Level  At rest   Oxygen Delivery 2L   Pulse Ox Heart Rate 76   Telemetry pattern Cardiac Rhythm Atrial fibrillation; Atrial flutter; pattern reported by Telemetry Clerk   Brief Assessment:   Cardiac Regular    Respiratory clear BS    Gastrointestinal details normal Soft  Nontender  Nondistended  No masses palpable  Bowel sounds normal   Lab Results: Hepatic:  03-Aug-13 06:35    Bilirubin, Total 0.2   Alkaline Phosphatase  193   SGPT (ALT) 32   SGOT (AST) 27   Total Protein, Serum 6.8   Albumin, Serum  2.2  Routine Chem:  03-Aug-13 06:35    Phosphorus, Serum 4.3 (Result(s) reported on 16 Mar 2012 at 07:35AM.)   Magnesium, Serum 1.9 (1.8-2.4 THERAPEUTIC RANGE: 4-7 mg/dL TOXIC: > 10 mg/dL  -----------------------)   Glucose, Serum  156   BUN  25   Creatinine (comp) 1.05   Sodium, Serum 137   Potassium, Serum 4.2   Chloride, Serum 106   CO2, Serum  20   Calcium (Total), Serum 9.6   Osmolality (calc) 281   eGFR (African American) >60   eGFR (Non-African American)  55 (eGFR values <70m/min/1.73 m2 may be an indication of chronic kidney disease (CKD). Calculated eGFR is useful in patients with stable renal function. The eGFR calculation will not be reliable in acutely ill patients when serum creatinine is changing rapidly. It is not useful in  patients on dialysis. The eGFR calculation may not be applicable to patients at the low and high extremes of body sizes, pregnant women, and vegetarians.)    Anion Gap 11  Routine Hem:  03-Aug-13 06:35    WBC (CBC) 10.3   RBC (CBC)  3.72   Hemoglobin (CBC)  10.2   Hematocrit (CBC)  31.1   Platelet Count (CBC)  579   MCV 84   MCH 27.5   MCHC 32.8   RDW  20.9   Neutrophil % 56.5   Lymphocyte % 25.5   Monocyte % 8.9   Eosinophil % 8.6   Basophil % 0.5   Neutrophil # 5.8   Lymphocyte # 2.6   Monocyte # 0.9   Eosinophil #  0.9   Basophil # 0.1 (Result(s) reported on 16 Mar 2012 at 07:36AM.)   Assessment/Plan:  Assessment/Plan:   Assessment 1) 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  patient refused to drink contrast.    Plan 1) continue octreotide at current dose 2) continue tpn 3) minimal po intake, clears only, no carbonation 4) awaiting result of ecg, recommend starting zofran 868mq 8 hours as clinically feasible.  5) orally contrasted ct of the abdomen and pelvis once patient willing to drink the contrast.   Electronic Signatures: SkLoistine SimasMD)  (Signed 03-Aug-13 18:05)  Authored: Chief Complaint, VITAL SIGNS/ANCILLARY NOTES, Brief Assessment, Lab Results, Assessment/Plan   Last Updated: 03-Aug-13 18:05 by SkLoistine SimasMD)

## 2014-12-01 NOTE — Discharge Summary (Signed)
PATIENT NAME:  Shannon Willis, Shannon Willis MR#:  [id number] DATE OF BIRTH:  1945/11/17  DATE OF ADMISSION:  02/12/2012 DATE OF DISCHARGE:  03/22/2012  DISCHARGE DIAGNOSES:  1. Abdominal abscess, presumably from diverticular rupture.  2. Enterocutaneous fistula. 3. Enterovesical fistula.  4. Atrial fibrillation.  5. Acute systolic heart failure in the setting of sepsis, now resolved. 6. Severe malnutrition. 7. Diabetes. 8. New onset seizures.  9. Hypertension. 10. Altered mental status, waxing and waning, presumably due to encephalopathy.  HISTORY OF PRESENT ILLNESS: Shannon Willis is a 69 year old woman with a history of hypertension, diabetes, and irritable bowel syndrome who was admitted on 02/12/2012 with the complaint of diarrhea and fatigue. Please see the admission note dictated by Dr. Marlaine HindAmir Darwish at the time.   HOSPITAL COURSE: The patient was admitted with abdominal abscess presumably from a perforated diverticula. She had surgery by Dr. Renda RollsWilton Smith on 02/13/2012.  At that time, she had laparotomy and drainage of a large abdominal abscess with approximately 5 pounds of feces in it.  She also had a separate creation of transverse colostomy as a diverting colostomy.  Please see Dr. Michaelle CopasSmith's operative note for details of the surgery.    Postoperatively the patient had complications including respiratory failure requiring several days of ventilation, atrial fibrillation, and acute systolic heart failure presumably from sepsis.  She had a lengthy intensive care unit stay. During that time, she was treated with IV ertapenem for the abscess.  She was seen by cardiology and started on amiodarone for control of her heart rate from atrial fibrillation.  She was started on TPN.  She remained with a Foley catheter in place and it often drained bilious material confirming that there was communication with the enterocutaneous fistula that also developed.  On 02/22/2012 she developed new onset seizures. She had a  CT of the head which was negative and was seen by neurology. Fevers were controlled with IV fosphenytoin. 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    The patient slowly improved and started to cooperative with physical therapy.  She was transferred from the intensive care unit to the floor on 03/13/2012. There she did continue to show some improvement with changes in her psychiatric medications by psych consultation and progression of physical therapy. However, in the last week of July, she developed increasing nausea and vomiting, was unable to tolerate any oral intake. Her fistula continued to have a large amount of output. However, her vesicula fistula seemed to improve as there was no longer any bilious material in her Foley catheter. She developed worsening mental status on 03/18/2012 and was transferred back to the intensive care unit. There she was found to be quite acidotic with mixed non-gap metabolic acidosis and respiratory acidosis. This was presumably due to the large amount of liquid output in her enterocutaneous fistula.  She was treated with a bicarbonate drip and her mental status improved. She was attempted to be placed on BiPAP but did not tolerate it.  At that point, palliative care was consulted.  Goals of care were set and the patient was made DNR. However, she slowly improved over the week and became more alert and interactive.    On 03/22/2012, the patient was awake and alert but very fatigued.  She had had intermittent episodes of atrial fibrillation but remained hemodynamically stable during this period. The [facility] after extensive discussion [facility] of this complicated surgical case.  She will eventually need repair of her enterocutaneous fistula as well as her enterovesical fistula. Her urological surgeon, Dr. Assunta Gambles, agreed that this would be best be done  in a team approach at an academic facility.   DISCHARGE PHYSICAL EXAMINATION: The patient on 03/22/2012 was awake and able to communicate but was very lethargic.  She had been afebrile overnight.  Her heart rate was 80, blood pressure was 156/59, respiratory rate 30, and pulse oximetry 100% on 1 liter by nasal cannula. Pupils are equally round and reactive to light and accommodation. Extraocular movements are intact. Sclerae are anicteric. Oropharynx is clear. Neck is supple. Her heart was regular with no murmurs. Lungs were clear. Her abdomen was somewhat distended. She has a colostomy in her left lower quadrant which has minimum output. She has in the midline lower quadrants a enterocutaneous fistula covered with an ostomy appliance.  There is liquid, green and brown, material in this.  The skin around it is macerated.  The patient has a Foley in place draining clear urine. She has trace bilateral lower extremity edema. She has a PICC line in her right upper arm with no signs of infection. Neurologically is awake, able to move all four extremities, can identify that she is at the hospital, but is quite lethargic.   LABS: On the day of discharge her potassium was 3.5, phosphorous 3.8, calcium 9.3, and magnesium 1.7.   On 03/21/2012, her ABG revealed a pH of 7.35, CO2 53, O2 108, FiO2 32%, and bicarb 29.3. Her BUN was 19, creatinine 0.67, sodium 140, chloride 104, bicarbonate 31, anion gap 5, glucose and 158. Her white blood cell count was 7.4, hemoglobin 8.9, and platelets 413.  MICRO DATA: Basically the patient had negative cultures throughout her hospital stay and was never bacteremic.    DISCHARGE MEDICATIONS (dictation stopped) ____________________________ Stann Mainland. Sampson Goon, MD dpf:slb D: 03/23/2012 09:49:00 ET T: 03/23/2012 10:21:44 ET JOB#: [id number]  cc: Stann Mainland. Sampson Goon, MD, <Dictator> Laniyah Rosenwald Sampson Goon MD ELECTRONICALLY SIGNED 03/29/2012 12:25

## 2014-12-01 NOTE — Consult Note (Signed)
Brief Consult Note: Diagnosis: consulted for a second opinion regarding managment of rhythm issues.   Discussed with Attending MD.   Comments: Prolonged hospitalization due to colovesical fistula and a large abdominal abcess. She had cardiac issues with paroxysmal A-fib and occasional bradycardia. Initial echo showed moderate LVSF but subsequently improved to normal (stress induced?). She was transferred back to ICU due to worsening respiratoy status, bradycardia and acidosis. She is now DNR. Currently on BiPAP but seems to be struggeling with this.  Overall prognosis seems to be very poor.  Continue to hold Diltiazem and Amiodarone for now due to bradycardia.  I agree with palliative care which is being considered by family.  Electronic Signatures: Lorine BearsArida, Karrin Eisenmenger (MD)  (Signed 05-Aug-13 19:36)  Authored: Brief Consult Note   Last Updated: 05-Aug-13 19:36 by Lorine BearsArida, Cyera Balboni (MD)

## 2014-12-01 NOTE — Consult Note (Signed)
Course of events noted.due for foley cath change based on orininal placement dateenter order for cath changeto follownew to suggest at this time.  Electronic Signatures: Smith Robertope, Khandi Kernes S (MD)  (Signed on 08-Aug-13 07:42)  Authored  Last Updated: 08-Aug-13 07:42 by Smith Robertope, Kaylanie Capili S (MD)

## 2014-12-01 NOTE — Consult Note (Signed)
Chief Complaint:   Subjective/Chief Complaint patient denies abdominal pain or nausea.  On clears po only to assist with treatment of enterocutaneous fistula.   VITAL SIGNS/ANCILLARY NOTES: **Vital Signs.:   08-Aug-13 15:00   Vital Signs Type Routine   Temperature Temperature (F) 98   Celsius 36.6   Temperature Source axillary   Pulse Pulse 78   Respirations Respirations 25   Systolic BP Systolic BP 416   Diastolic BP (mmHg) Diastolic BP (mmHg) 69   Mean BP 95   Pulse Ox % Pulse Ox % 97   Pulse Ox Activity Level  At rest   Oxygen Delivery 3L   Pulse Ox Heart Rate 78    18:00   Vital Signs Type Routine   Pulse Pulse 80   Respirations Respirations 25   Systolic BP Systolic BP 384   Diastolic BP (mmHg) Diastolic BP (mmHg) 63   Mean BP 100   Pulse Ox % Pulse Ox % 96   Pulse Ox Activity Level  At rest   Oxygen Delivery 1L; Nasal Cannula   Pulse Ox Heart Rate 80   Brief Assessment:   Cardiac Regular    Respiratory clear BS    Gastrointestinal details normal Soft  Nontender  Nondistended  bowel sounds positive  drainage bag over midline fistula site.  colostomy left abdomen   Lab Results: Hepatic:  08-Aug-13 04:35    Albumin, Serum  1.8 (Result(s) reported on 21 Mar 2012 at 05:11AM.)  Lab:  08-Aug-13 11:55    pH (ABG)  7.35   PCO2  53   PO2  108   FiO2 32   Base Excess 2.5   HCO3  29.3   O2 Saturation 97.9   O2 Device CANNULA   Specimen Site (ABG) LT RADIAL   Specimen Type (ABG) ARTERIAL   Patient Temp (ABG) 37.0 (Result(s) reported on 21 Mar 2012 at 11:56AM.)   Lactic Acid, Cardiopulmonary 1.2 (Result(s) reported on 21 Mar 2012 at 11:56AM.)  Routine Chem:  08-Aug-13 04:35    Glucose, Serum  158   BUN  19   Creatinine (comp) 0.67   Sodium, Serum 140   Chloride, Serum 104   CO2, Serum 31   Anion Gap  5   Osmolality (calc) 285   eGFR (African American) >60   eGFR (Non-African American) >60 (eGFR values <53m/min/1.73 m2 may be an indication of  chronic kidney disease (CKD). Calculated eGFR is useful in patients with stable renal function. The eGFR calculation will not be reliable in acutely ill patients when serum creatinine is changing rapidly. It is not useful in  patients on dialysis. The eGFR calculation may not be applicable to patients at the low and high extremes of body sizes, pregnant women, and vegetarians.)   Result Comment LABS - This specimen was collected through an   - indwelling catheter or arterial line.  - A minimum of 542m of blood was wasted prior    - to collecting the sample.  Interpret  - results with caution.  Result(s) reported on 21 Mar 2012 at 05:11AM.   Potassium, Serum 3.6   Phosphorus, Serum 3.1 (Result(s) reported on 21 Mar 2012 at 05:11AM.)   Calcium (Total), Serum 9.2 (Result(s) reported on 21 Mar 2012 at 05:11AM.)   Magnesium, Serum 1.8 (1.8-2.4 THERAPEUTIC RANGE: 4-7 mg/dL TOXIC: > 10 mg/dL  -----------------------)  Routine Hem:  08-Aug-13 04:35    WBC (CBC) 7.4   RBC (CBC)  3.16   Hemoglobin (CBC)  8.9  Hematocrit (CBC)  26.4   Platelet Count (CBC) 413   MCV 84   MCH 28.3   MCHC 33.8   RDW  20.4   Neutrophil % 56.0   Lymphocyte % 25.8   Monocyte % 9.2   Eosinophil % 8.4   Basophil % 0.6   Neutrophil # 4.1   Lymphocyte # 1.9   Monocyte # 0.7   Eosinophil # 0.6   Basophil # 0.0 (Result(s) reported on 21 Mar 2012 at 05:04AM.)   Assessment/Plan:  Assessment/Plan:   Assessment 1) nausea- patient denying nausea 2) abdominal pain-patient denies abd pain, no apparent pain to palpation 3) enterocutaneous fistula-continues to drain, now with ostomy type bag in place 4) intrabdominal abscess with 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  fistula as above draining.  patient has been on octreotide at appropriate dose for treatment of enterocutaneous fistula, and on clears only for several days.  According to literature,  respose to closure of the fistula may only be 5-6 days, and octreotide may not be effective after that time    Plan 1) continue current.  will discuss further with Dr Tamala Julian.   Electronic Signatures: Loistine Simas (MD)  (Signed 08-Aug-13 19:37)  Authored: Chief Complaint, VITAL SIGNS/ANCILLARY NOTES, Brief Assessment, Lab Results, Assessment/Plan   Last Updated: 08-Aug-13 19:37 by Loistine Simas (MD)
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
## 2014-12-01 NOTE — Consult Note (Signed)
Chief Complaint:   Subjective/Chief Complaint patient denies nausea, mild llq abdominal discomfort, no emesis   VITAL SIGNS/ANCILLARY NOTES: **Vital Signs.:   04-Aug-13 11:35   Vital Signs Type Routine   Temperature Temperature (F) 97.5   Celsius 36.3   Temperature Source Oral   Pulse Pulse 53   Respirations Respirations 20   Systolic BP Systolic BP 115   Diastolic BP (mmHg) Diastolic BP (mmHg) 56   Mean BP 75   Pulse Ox % Pulse Ox % 96   Pulse Ox Activity Level  At rest   Oxygen Delivery 2L   Pulse Ox Heart Rate 76   Telemetry pattern Cardiac Rhythm Atrial fibrillation; Atrial flutter; pattern reported by Telemetry Clerk   Brief Assessment:   Cardiac Irregular    Respiratory clear BS    Gastrointestinal details normal Soft  Nontender  Nondistended  Bowel sounds normal  bag to fistula draining medium bilious/thick material, colostomy bag with small bit of stool in the orifice, none in the bag.   Lab Results: Routine Chem:  04-Aug-13 03:42    Phosphorus, Serum 3.6 (Result(s) reported on 17 Mar 2012 at 04:35AM.)   Magnesium, Serum 1.9 (1.8-2.4 THERAPEUTIC RANGE: 4-7 mg/dL TOXIC: > 10 mg/dL  -----------------------)   Potassium, Serum 4.2 (Result(s) reported on 17 Mar 2012 at 04:35AM.)   Calcium (Total), Serum 9.6 (Result(s) reported on 17 Mar 2012 at 04:35AM.)   Assessment/Plan:  Assessment/Plan:   Assessment 1) probable diverticular abscess with enterocutaneous fistula-drainage down to 250cc in 24 hours. minimal output colostomy.  Patietn on clears, subcutaneous octreotide.   2) nausea/vomiting-improved on questioning patient.  no emesis reported.  if recurrent, woudl start zofran iv 4 mg q 6 hours as scheduled dose increasing to 8mg  iv q 8 hours, monitoring daily ecg.  Appreciate IM and Cardiology input.    Plan as above.   Electronic Signatures: Barnetta ChapelSkulskie, Martin (MD)  (Signed 04-Aug-13 14:44)  Authored: Chief Complaint, VITAL SIGNS/ANCILLARY NOTES, Brief  Assessment, Lab Results, Assessment/Plan   Last Updated: 04-Aug-13 14:44 by Barnetta ChapelSkulskie, Martin (MD)

## 2014-12-01 NOTE — Consult Note (Signed)
PATIENT NAME:  Shannon Willis, Shannon Willis MR#:  [id number] DATE OF BIRTH:  [birth year]  DATE OF CONSULTATION:  03/15/2012  REFERRING PHYSICIAN:   CONSULTING PHYSICIAN:  Keturah Barrehristiane H. Genie Wenke, NP  HISTORY OF PRESENT ILLNESS: Ms. Lendell CapriceSullivan is a 69 year old Caucasian woman who was admitted for diarrhea, found to have colovesicular fistula which was treated by operative repair by Dr. Renda RollsWilton Smith. Also at the time there was concern over a partial small bowel obstruction on initial CT. GI has been consulted by Dr. Sampson GoonFitzgerald for evaluation of nausea and vomiting. Patient's history is significant for irritable bowel syndrome, hypertension, asthma, migraines, cystocele, rectocele, recurrent urinary tract infections. During this hospitalization she has been consulted by psych who are following her for some delirium and depression, neurology for new onset of seizures, possibly thought being related to Xanax withdrawal and cardiology for history of atrial fibrillation, now alternates with sinus rhythm and atrial fibrillation, status post amiodarone dosing. Patient is very lethargic at present. I am unable to obtain review of systems. Evidently she per chart review and discussion with Dr. Sampson GoonFitzgerald she has been having problems with nausea and vomiting despite antiemetics. Noted no relief per chart after Reglan was changed to standing order. Her Zofran remains p.r.n. At present she is not complaining of nausea and vomiting. She falls asleep as I am interviewing her. Per nursing staff she is unable to hold anything besides small amounts of liquids down. Her pills seem to stay down most of the time.   PAST MEDICAL HISTORY:  1. Irritable bowel syndrome.  2. Gastroesophageal reflux syndrome. 3. Recurrent urinary tract infection.  4. Cystocele, rectocele, rectovesicular fistula.  5. Asthma.  6. Hypertension.  7. Migraine. 8. Depression. 9. Anxiety. 10. Degenerative joint disease.   PAST SURGICAL HISTORY:  1. Hysterectomy.   2. Cholecystectomy.  3. Right foot bunion surgery.   SOCIAL HABITS: Nonsmoker. No history of alcohol abuse. Now widowed. Lives at home alone. Visited by daughters. This information is being gleaned from her history and physical.   FAMILY HISTORY: Significant for congestive heart failure, chronic obstructive pulmonary disease.   CURRENT MEDICATIONS HERE IN THE HOSPITAL:  1. Alprazolam 0.5 mg p.o. t.i.d.  2. Amiodarone 200 mg p.o. daily.  3. Diltiazem 120 mg p.o. at bedtime.  4. Diltiazem 240 mg p.o. q.a.m.  5. Ertapenem 1 gram IV q.24. 6. Losartan 50 mg p.o. daily.  7. Reglan 5 mg IV t.i.d. a.c.  8. Nystatin powder. 9. Octreotide injection 200 mcg IV push q.4.  10. Ondansetron 4 mg q.4 p.r.n.  11. Phenytoin 100 mg p.o. daily.  12. Zantac 150 mg p.o. daily.  13. Trace elements plus iron and iodide. 14. Trazodone 50 mg p.o. at bedtime.  15. Venlafaxine 150 mg p.o. daily.  16. Lovenox 40 mg p.o. daily.  17. Insulin sliding scale.  18. Albuterol inhaler q.4 hours. 19. Fluticasone inhaler q.12. 20. Ensure t.i.d. TPN.  P.R.N. MEDICATIONS:  1. Acetaminophen. 2. Parafon forte. 3. Diazepam. 4. Haldol. 5. Hydralazine. 6. Lacri-Lube eyedrops.   ALLERGIES: Penicillin, sulfa.   REVIEW OF SYSTEMS: I am and unable to obtain a current review of systems at this time due to patient's mentation, however, preadmission she had some chills and fatigue. She had no blurring of vision or double vision. ENT: No hearing impairment, sore throat, dysphagia. CARDIOVASCULAR: No chest pain, shortness of breath, syncope. RESPIRATORY: No cough, hemoptysis, sputum production, shortness of breath. GASTROINTESTINAL: No abdominal pain, vomiting, hematochezia, melena. GENITOURINARY: No dysuria. No increased frequency of urination. MUSCULOSKELETAL: No  joint pain or swelling. INTEGUMENTARY: No erythema, lesion or rash. NEUROLOGY: At the time of admission there was no history of weakness or seizure activity or  headaches. PSYCH: Does have history of depression, anxiety. ENDOCRINE: No history of polyuria or polydipsia. No hot or cold intolerances.   LABORATORY, DIAGNOSTIC AND RADIOLOGICAL DATA: Most recent lab work: Glucose 163, BUN 29, creatinine 1.26, sodium 135, potassium 4.1, GFR 44, calcium 9.0, magnesium 1.5, phosphorus 3.6, total serum protein 5.6, albumin 1.7, total bilirubin 0.2, direct bilirubin less than 0.1, alkaline phosphatase 150, AST 30. These are improvements from yesterday. ALT 30, WBC 9.9, hemoglobin 10.6, hematocrit 30.9, platelets 488, red cells are normocytic with increased RDW. An attempt for a small bowel follow through was attempted on 07/29. The patient had nausea and vomiting and was unable to complete this. No recent abdominal imaging has been obtained.   PHYSICAL EXAMINATION:  VITAL SIGNS: Most recent vital signs: Temperature 97.3, pulse 60, respiratory rate 24, blood pressure 113/53, oxygen saturation 94% to 96% on room air. Cardiac rhythm has been alternating between sinus rhythm and atrial fibrillation today.   GENERAL: Elderly drowsy female lying in bed in no acute distress. Does fall asleep often.   PSYCH: Sleepy, difficult to assess at this time.   HEENT: Normocephalic, atraumatic. No redness, drainage, or erythema to the eyes or the nares. Oral mucous membranes are pink and moist.   NECK: Supple. No abnormalities.   RESPIRATORY: Respirations eupneic. Lungs clear bilaterally with diminished bases bilaterally.   CARDIAC: S1 and S2. No MRG. Mild intermittent irregularity. Peripheral pulses are 2+. Generalized trace edema.   ABDOMEN: Obese abdomen. Bowel sounds x4. Soft. Minimal tenderness to palpation. No rebound tenderness, peritoneal signs, hepatosplenomegaly, masses, or hernias. There is a colostomy to the left side of the abdomen with pink intact stoma. There is no drainage in the bag. There is a bag collecting yellow-green drainage to the mid suprapubic area. This  apparently was a former Penrose site and continues to drain. There is no erythema or purulent drainage around this. There is a well healing lower midline scar.   GENITOURINARY: Foley present draining clear yellow urine.   RECTAL: Deferred.   SKIN: Warm, dry, somewhat pale. No erythema, lesion or rash.   MUSCULOSKELETAL: No cyanosis or clubbing. Moves all extremities spontaneously.   NEUROLOGIC: Confused. Awakens to verbal. Follow some commands. Cranial nerves II through XII appear intact. Speech is clear but slurry at times. No facial droop. Pupils are equal and reactive, 3 mm bilaterally. No inequalities in muscle strength or movement.   PSYCH: Difficult to assess mentation due to drowsiness. Flat affect when awake. Not hostile or combative. Does attempt to interact with me when I talk to her.   IMPRESSION AND PLAN: Nausea and vomiting. Differentials include small bowel obstruction, antibiotic side effects, others. Abdomen is nondistended and nontender. There is very little drainage from the colostomy over since it has been inserted. There has been large amounts of drainage from the drain site that has been yellowish-green per chart review. Will obtain imaging of small bowel via abdominopelvic CT. Would also made n.p.o. for now as patient has been unable to tolerate most p.o. Will also schedule Zofran 8 mg IV q.8 for the next 48 hours. I also discussed the patient with Dr. Sampson Goon in light of her neurologic status. Evidently this has waxed and waned during admission. Her fingerstick is now 152, blood pressure 153/53, heart rate 64, respiratory rate 18 to 24, oxygen saturation is 94%  room air. She is being followed by psych as well for medication adjustments related to her depression, increased home use of benzodiazepines.   Thank you for this consult. GI will follow with you.   These services were provided by Vevelyn Pat, MSN, NPC in collaboration with Christena Deem, M.D. with whom I  discussed this patient in full.  ____________________________ Keturah Barre, NP chl:cms D: 03/15/2012 19:30:58 ET T: 03/16/2012 08:08:58 ET JOB#: [id number]  cc: Keturah Barre, NP, <Dictator> Eustaquio Maize Lanaiya Lantry FNP ELECTRONICALLY SIGNED 03/18/2012 7:50

## 2014-12-01 NOTE — Discharge Summary (Signed)
PATIENT NAME:  Shannon Willis, Shannon Willis MR#:  [id number] DATE OF BIRTH:  07/11/1946  DATE OF ADMISSION:  02/12/2012 DATE OF DISCHARGE:  03/22/2012  ADDENDUM: Continuation of dictation.  DISCHARGE MEDICATIONS: 1. Losartan 50 mg once a day. 2. Metoprolol 12.5 mg every 12 hours. 3. Nitroglycerin patch 0.2 mg applied daily. 4. Octreotide 200 mg subcutaneous every 8 hours. 5. Zantac 50 mg IV every 8 hours. 6. TPN. 7. Effexor XR 150 mg once a day. 8. Lovenox 40 mg subcutaneous every 24 hours. 9. Sliding scale insulin. 10. Amiodarone infusion. 11. Pulmicort nebulizers every 12 hours. 12. Diazepam 2 mg IV every 8 hours p.r.n. anxiety. 13. Hydralazine 10 mg IV every 4 hours p.r.n. hypertension.  DISCHARGE DIET: The patient was NPO at the time of discharge, but was considering advancing to clear liquids. She had episodes of nausea and vomiting and this may be related to her fistula.   DISCHARGE PLACE: The patient is discharged [facility], to the surgical service, for further management.   HOSPITAL ISSUES:  1. Enterocutaneous fistula with vesicular connection as well. This is her main issue. She had very large abdominal abscess which per Dr. Katrinka BlazingSmith was walled off by the loops of bowel. She has continued to drain a large amount from her fistula. She has been unable to advance her diet. She has been on TPN to try to improve her nutrition. Dr. Achilles Dunkope of urology has followed her and she has a Foley catheter in place. There was at one point a CT done with cystogram that showed that there was a continued communication.  However, she has had no bilious or bowel contents in her Foley catheter for several weeks. Our hope is that with [facility] she can have definitive surgery for repair of her small bowel enterocutaneous fistula and vesicular rupture. 2. Persistent nausea and vomiting. Gastroenterology has been involved in this.  She has been on octreotide to try to decrease her output from her fistula.  She has been on Zofran as well. Reglan was tried but did not work and was stopped. The decision was made to try more bowel rest and not try to advance her diet.  3. Neuro. The patient was complicated by initial seizures which were presumably due to withdrawal from Xanax.  She was treated with fosphenytoin and then oral Dilantin over a prolonged period, but this was weaned approximately one week ago and she has been seizure free. Another neurological issue has been her waxing and waning mental status presumably due to encephalopathy. She did have an episode of profound acidosis presumably due to loss of bicarb in her enterocutaneous fistula. Her mental status was very low at this point but improved after IV bicarb drip.  4. Atrial fibrillation. This has been a complicated issue with frequent episodes of atrial fibrillation with rapid ventricular response. She has been loaded with amiodarone and rate has been controlled with other diltiazem or metoprolol. There were however episodes of bradycardia associated with this.  Cardiology feels she likely has sick sinus syndrome.  Family was approached about potential need for permanent pacemaker, however, that does not seem appropriate at this point given her other ongoing illnesses.  5. Nutrition. The patient has been on TPN ever since her surgery. She does continue to have low albumin. She has been unable to tolerate any advancement in her diet. 6. Diabetes. This has been fairly well controlled with sliding scale insulin. 7. Hypertension. She has been on her outpatient losartan. We also  put her on a nitroglycerin patch to help control her blood pressure. She did have an echocardiogram at one point which showed an ejection fraction of 25 to 35%, but several weeks after her critical illness resolved her ejection fraction improved to 55%.  8. ID. The patient was treated for approximately 6 weeks with ertapenem. All of her cultures were negative. Ertapenem was stopped on  03/20/2012. There was no evidence of continued abscess.  Her white count has been normal since that time and she has had no fevers. Of course she is at increased risk of developing recurrent abscess and may need further antibiotics.          9. Disposition. The patient was made DNR, however, the family is wishing to continue with attempt to resolve this enterocutaneous fistula.  ____________________________ Stann Mainland. Sampson Goon, MD dpf:slb D: 03/23/2012 10:00:47 ET T: 03/23/2012 10:54:32 ET JOB#: [id number]  cc: Stann Mainland. Sampson Goon, MD, <Dictator> [facility] - Room 2704, nurse's bed station Adolpho Meenach [facility] MD ELECTRONICALLY SIGNED 03/29/2012 12:26

## 2014-12-01 NOTE — Consult Note (Signed)
Pt doing much better after catheter change. Order entered for no catheter change less than 22 fr bard catheter. Urine will go retrograde into abdomen and fistula without adequate bladder drainage Pt will need occasional irrigation due to debris which will be expected with fistula. Discussed with family  Pt seen, chart reviewed.  Electronic Signatures: Smith Robertope, Rashida Ladouceur S (MD)  (Signed on 09-Aug-13 14:24)  Authored  Last Updated: 09-Aug-13 14:24 by Smith Robertope, Arnez Stoneking S (MD)

## 2014-12-01 NOTE — Consult Note (Signed)
Chief Complaint:   Subjective/Chief Complaint Please see full GI consult.  Patient seen and examined, chart reviewed, discussed with Dr Katrinka BlazingSmith.  Patietn admitted 02/12/12 with diarrhea, fatigue and finding of feces in urine.  Subsequent drainage of abscess in the pelvis with diverting colostomy.  .  Minimal output from ostomy, most output from incision over abscess site.  Increasing n/v over the past week, has had n/v since admission per patient.  Recommend stopping reglan, as promotility agent may increase fistula output,  slowing rate of closure.  Change octreotide to 200 mcg subcutaneous q 8 hours.  Zofran 8 mg iv q 8 hours for 6 scheduled doses. Unsuccessful attempt at sbft noted, recommend CT with oral contrast to further evaluate for anomalous pathway of fistula.   Electronic Signatures for Addendum Section:  Barnetta ChapelSkulskie, Martin (MD) (Signed Addendum 03-Aug-13 00:37)  interactions with zofran and halsol andamiodarone noted, will d/w PMD in am.   Electronic Signatures: Barnetta ChapelSkulskie, Martin (MD)  (Signed 03-Aug-13 00:21)  Authored: Chief Complaint   Last Updated: 03-Aug-13 00:37 by Barnetta ChapelSkulskie, Martin (MD)

## 2014-12-01 NOTE — Consult Note (Signed)
Pt still with significant fistula out put so will increase Sandostatin to 200 micrograms every 4 hours.  Electronic Signatures: Scot JunElliott, Robert T (MD)  (Signed on 25-Jul-13 17:32)  Authored  Last Updated: 25-Jul-13 17:32 by Scot JunElliott, Robert T (MD)

## 2014-12-01 NOTE — Op Note (Signed)
PATIENT NAME:  Shannon CumminsSULLIVAN, Victoriah J MR#:  [id number] DATE OF BIRTH:  [birth year]  DATE OF PROCEDURE:  02/28/2012  PREOPERATIVE DIAGNOSES:  1. Poor venous access.  2. Septic shock with abdominal abscess.  3. Respiratory failure.  4. Severe malnutrition and need for hyperalimentation.   POSTOPERATIVE DIAGNOSES: 1. Poor venous access.  2. Septic shock with abdominal abscess.  3. Respiratory failure.  4. Severe malnutrition and need for hyperalimentation.   PROCEDURES:  1. Ultrasound guidance for vascular access to right basilic vein.  2. Fluoroscopic guidance for placement of catheter.  3. Insertion of dual lumen catheter, right arm.   SURGEON: Annice NeedyJason S. Dew, MD  ANESTHESIA: Local.   ESTIMATED BLOOD LOSS: Minimal.   INDICATION FOR PROCEDURE: This is a 69 year old female with abdominal abscess and severe malnutrition who requires TPN for nourishment and has poor venous access. A dual lumen PICC line is being placed. The risks and benefits were discussed and informed consent obtained.  DESCRIPTION OF PROCEDURE: The patient's right arm was sterilely prepped and draped, and a sterile surgical field was created. The right basilic vein was accessed under direct ultrasound guidance without difficulty with a micropuncture needle and permanent image was recorded. 0.018 wire was then placed into the superior vena cava. Peel-away sheath was placed over the wire. A single lumen peripherally inserted central venous catheter was then placed over the wire and the wire and peel-away sheath were removed. The catheter tip was placed into the superior vena cava and was secured at the skin at 36 cm with a sterile dressing. The catheter withdrew blood well and flushed easily with heparinized saline. The patient tolerated procedure well. ____________________________ Annice NeedyJason S. Dew, MD jsd:slb D: 02/28/2012 11:57:35 ET T: 02/28/2012 12:26:35 ET JOB#: [id number]  cc: Annice NeedyJason S. Dew, MD, <Dictator> Annice NeedyJASON S DEW  MD ELECTRONICALLY SIGNED 03/04/2012 16:20

## 2014-12-01 NOTE — Consult Note (Signed)
Chief Complaint:   Subjective/Chief Complaint events/transfer noted, change of mental status and respiratory difficulty.   VITAL SIGNS/ANCILLARY NOTES: **Vital Signs.:   05-Aug-13 16:02   Vital Signs Type Routine   Temperature Temperature (F) 99.2   Celsius 37.3   Temperature Source axillary   Pulse Pulse 56   Respirations Respirations 28   Systolic BP Systolic BP 478   Diastolic BP (mmHg) Diastolic BP (mmHg) 52   Mean BP 81   Pulse Ox % Pulse Ox % 100   Oxygen Delivery bipap 35%   Pulse Ox Heart Rate 56   Telemetry pattern Cardiac Rhythm Atrial fibrillation; Atrial flutter; pattern reported by Telemetry Clerk   Brief Assessment:   Cardiac Irregular    Respiratory clear BS    Gastrointestinal details normal Soft  Nontender  Nondistended  No masses palpable  Bowel sounds normal   Lab Results:  Hepatic:  05-Aug-13 04:08    Bilirubin, Total 0.2   Alkaline Phosphatase  187   SGPT (ALT) 23   SGOT (AST) 16   Total Protein, Serum 6.7   Albumin, Serum  2.3  Lab:  05-Aug-13 09:25    pH (ABG)  7.13   PCO2  51   PO2  104   FiO2 28   Base Excess  -12.2   HCO3  17.0   O2 Saturation 95.8   Specimen Site (ABG) RT RADIAL   Patient Temp (ABG) 37.0    14:35    pH (ABG)  7.22   PCO2  46   PO2  127   FiO2 35   Base Excess  -8.8   HCO3  18.8   O2 Saturation 98.6   O2 Device BIPAP   Specimen Site (ABG) RT RADIAL   Specimen Type (ABG) ARTERIAL   Patient Temp (ABG) 37.0   PSV 12   PEEP 5.0 (Result(s) reported on 18 Mar 2012 at 02:41PM.)  Routine Chem:  05-Aug-13 04:08    Phosphorus, Serum 4.0 (Result(s) reported on 18 Mar 2012 at 04:40AM.)   Magnesium, Serum 2.0 (1.8-2.4 THERAPEUTIC RANGE: 4-7 mg/dL TOXIC: > 10 mg/dL  -----------------------)   Glucose, Serum  172   BUN  33   Creatinine (comp) 0.79   Sodium, Serum 138   Potassium, Serum 4.4   Chloride, Serum  109   CO2, Serum  19   Calcium (Total), Serum 9.6   Osmolality (calc) 287   eGFR (African American) >60    eGFR (Non-African American) >60 (eGFR values <66m/min/1.73 m2 may be an indication of chronic kidney disease (CKD). Calculated eGFR is useful in patients with stable renal function. The eGFR calculation will not be reliable in acutely ill patients when serum creatinine is changing rapidly. It is not useful in  patients on dialysis. The eGFR calculation may not be applicable to patients at the low and high extremes of body sizes, pregnant women, and vegetarians.)   Anion Gap 10    09:25    Result Comment - READ-BACK PROCESS PERFORMED.  -Manuela SchwartzRN [id number] - 03-18-12  Result(s) reported on 18 Mar 2012 at 09:31AM.  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  drainage amount today no accurate due to leakage of bag.   2) acutely worsening with acidosis, respiratory difficulty    Plan 1) no new GI recs.  recommend ct abd with contrast when possible.  patient refused to drink contrast on last attempt.   Electronic Signatures: Loistine Simas (MD)  (Signed 05-Aug-13 17:03)  Authored: Chief Complaint, VITAL SIGNS/ANCILLARY NOTES, Brief Assessment, Lab Results, Assessment/Plan   Last Updated: 05-Aug-13 17:03 by Loistine Simas (MD)

## 2014-12-01 NOTE — Consult Note (Signed)
Ct cystogram done today.as expected with persistant fistula from dome of bladder.loops of SB in the area which is likely the source of the SB leak.significant residual fluid collection.need eventual definitive surgery for correctionlung status and overall nutritional status, would project in more distatnt future.stool in bladder, bladder otherwise in reasonable shape.follow.  No changes at present. need foley until definitive surgical correction done.  Electronic Signatures: Smith Robertope, Tyshawna Alarid S (MD)  (Signed on 16-Jul-13 17:01)  Authored  Last Updated: 16-Jul-13 17:01 by Smith Robertope, Jynesis Nakamura S (MD)

## 2014-12-06 NOTE — Consult Note (Signed)
Some continued improvment.urology situation with patient.change to gravity foley drainage today.set up CT cystogram next week.CT done before for any other reason, cystogram should be done at same time.continue to follow.  Electronic Signatures: Smith Robertope, Devani Odonnel S (MD)  (Signed on 11-Jul-13 07:53)  Authored  Last Updated: 11-Jul-13 07:53 by Smith Robertope, Sya Nestler S (MD)

## 2014-12-06 NOTE — Consult Note (Signed)
Brief Consult Note: Diagnosis: Sinus tacycardia secondary to CHF, underlying co-morbidities.   Patient was seen by consultant.   Consult note dictated.   Comments: REC  Agree with current therapy, repeat furosemide at 4:30, consider IV metoprolol for problematic sinus tachycardia, or IV cardizem for SVT/AF.  Electronic Signatures: Marcina MillardParaschos, Armanie Martine (MD)  (Signed 08-Jul-13 13:16)  Authored: Brief Consult Note   Last Updated: 08-Jul-13 13:16 by Marcina MillardParaschos, Amir Glaus (MD)

## 2014-12-06 NOTE — Consult Note (Signed)
Foley changed to gravity drainage this AM.UOP, Clearnoted with seizureplan on CT cystogram early next week or sooner if other CT needed.  Electronic Signatures: Smith Robertope, Huxley Shurley S (MD)  (Signed on 11-Jul-13 17:12)  Authored  Last Updated: 11-Jul-13 17:12 by Smith Robertope, Martasia Talamante S (MD)

## 2014-12-06 NOTE — Consult Note (Signed)
Good UOP, blood tinged.otherwise notedimprovment in renal fxnon chest tube bladder drainage for around 7 days and then will convert to gravity.  Electronic Signatures: Smith Robertope, Tashea Othman S (MD)  (Signed on 05-Jul-13 07:30)  Authored  Last Updated: 05-Jul-13 07:30 by Smith Robertope, Kaoru Rezendes S (MD)

## 2014-12-06 NOTE — Consult Note (Signed)
PATIENT NAME:  Shannon Willis, Shannon Willis MR#:  [id number] DATE OF BIRTH:  [birth year]  DATE OF CONSULTATION:  02/12/2012  REFERRING PHYSICIAN:   Dr. Sampson GoonFitzgerald CONSULTING PHYSICIAN:  Suszanne ConnersMichael R. Evelene CroonWolff, MD  REASON FOR CONSULTATION: Possible bladder fistula.   HISTORY OF PRESENT ILLNESS: Mrs. Shannon CapriceSullivan is a 69 year old Caucasian female with a one-month history of diarrhea and a more recent history of lethargy and fatigue. An attempt was made to place a Foley catheter in the ER and fecal material was obtained. The patient denies prior history of bladder sling or bladder surgery. She has no history of bladder fistula. She does have a history of stress and urge incontinence and has been wearing a pad for some time now. She may have had A and P vaginal repair and definitely had a hysterectomy some time in the distant past.   ALLERGIES: Penicillin and sulfa.   CHRONIC MEDICATIONS:  1. Ventolin inhaler.  2. Flovent inhaler. 3. Flomax. 4. Sulindac. 5. Omeprazole. 6. Nortriptyline. 7. Macrodantin. 8. Losartan. 9. Hydrochlorothiazide. 10. Chlorzoxazone. 11. Xanax.   PAST SURGICAL HISTORY:  1. Hysterectomy.  2. Cholecystectomy.  3. Foot bunion repair.   SOCIAL HISTORY: She denied tobacco or alcohol use.   FAMILY HISTORY: Noncontributory.   PAST AND CURRENT MEDICAL CONDITIONS:  1. Irritable bowel syndrome.  2. Gastroesophageal reflux disease.  3. Recurrent urinary tract infections. 4. History of vaginal prolapse.  5. Asthma.  6. Hypertension.  7. Migraine headaches.  8. Depression, anxiety. 9. Degenerative joint disease.   PHYSICAL EXAMINATION:  GENERAL: Chronically ill-appearing white female in distress.   ABDOMEN: Soft. She had a 10-cm palpable mass in the lower abdomen slightly to the left side of the midline.   GU/RECTAL: She had atrophic external genitalia. Urethral meatus was intravaginal. She had fecal material present within the vaginal vault. She had an obvious rectovaginal fistula  and possible rectal mass per rectal exam. Cervix and uterus are absent.   PERTINENT LABORATORY STUDIES: BUN 30 and creatinine 1.65.   CT scan report dated 10/03/2011 revealed a right renal calcification but no hydronephrosis or bladder masses or bladder fistula.   IMPRESSION:  1. Rectovaginal fistula. 2. Lower abdominal mass and rectal mass.   PLAN:  1. Suggest abdominopelvic CT scan and general surgery consultation. 2. It is doubtful that she has a bladder fistula at this point, but this can certainly be ruled out with contrast-enhanced CT study.   3. Reconsult urology if there is bladder involvement or involvement of the ureters with this rectal/ abdominal mass and rectal fistula.    ____________________________ Suszanne ConnersMichael R. Evelene CroonWolff, MD [email or web address] D: 02/12/2012 13:01:27 ET T: 02/12/2012 13:09:22 ET JOB#: [id number]  cc: Suszanne ConnersMichael R. Evelene CroonWolff, MD, <Dictator> Orson ApeMICHAEL R WOLFF MD ELECTRONICALLY SIGNED 02/12/2012 16:36

## 2014-12-06 NOTE — Consult Note (Signed)
Brief Consult Note: Diagnosis: patient admitted with dehydration, chronic kidney disease and chronic diarrhea.  She is transiently developed atrial fibrillation but is converted back to sinus rhythm with a short course of IV diltiazem.   Patient was seen by consultant.   Recommend further assessment or treatment.   Comments: 69 year old female with history of chronic kidney disease grade 3, COPD, hypertension and hyperlipidemia as well as respiratory failure, failure to thrive and chronic diarrhea.  She is currently intubated.  She recently developed atrial fibrillation with rapid ventricular response.  She was given IV Cardizem and converted to sinus rhythm.  She is relatively hypotensive but remains in sinus rhythm.  She was taken off of IV Cardizem.  Would recommend closely final rhythm.  Should she develop recurrent atrial fibrillation, would resume Cardizem and converted to p.o. when taking p.o.  Electronic Signatures: Dalia HeadingFath, Tyona Nilsen A (MD)  (Signed 05-Jul-13 16:37)  Authored: Brief Consult Note   Last Updated: 05-Jul-13 16:37 by Dalia HeadingFath, Miguelangel Korn A (MD)

## 2014-12-06 NOTE — Op Note (Signed)
PATIENT NAME:  Shannon Willis, Shannon Willis MR#:  [id number] DATE OF BIRTH:  06-15-1946  DATE OF PROCEDURE:  02/13/2012  PREOPERATIVE DIAGNOSES: Diverticulitis with colovesical fistula, intra-abdominal abscess, partial small bowel obstruction.   POSTOPERATIVE DIAGNOSES: Diverticulitis with colovesical fistula, intra-abdominal abscess, partial small bowel obstruction.   PROCEDURES: Laparotomy and creation of transverse colostomy, incision and drainage of abdominal abscess, insertion of central venous catheter.   SURGEON: Renda Rolls, MD  ANESTHESIA: General.   INDICATIONS: This 69 year old female was admitted with a feculent urine and large abdominal mass with CT findings of large abdominal abscess and evidence of partial small bowel obstruction. Surgery was recommended for definitive treatment. It also appeared that she may need total parenteral nutrition and so therefore a central venous catheter was inserted.  DESCRIPTION OF PROCEDURE: The patient was placed on the operating table in the supine position under general endotracheal anesthesia. The anesthetist inserted a nasogastric tube  draining about 1 liter of bile. The abdomen was prepared with ChloraPrep and draped in a sterile manner. There was a large palpable mass in the lower abdomen. A suprapubic midline incision was made some 2 inches in length and carried down through subcutaneous tissues and incised the deep fascia and there immediate drainage of a large amount of pus and there was a large amount of feces within this abscess cavity. This was a somewhat tedious undertaking to evacuate all of the stool which amounted to what may be about 2 pounds of feces.  There was a spoon available to use to help evacuate the feces. After evacuating as much feces as possible, the wound was irrigated with a total of 2 liters of saline and also some debridement of necrotic tissue was carried out. I could visualize a number of loops of intestine which comprised the  wall of the abscess. Next, two 1-inch Penrose drains were placed down into the abscess cavity and these were sutured to the skin with 3-0 nylon suture. Next, this area was further cleaned and all the drapes were removed and the abdomen was again prepared with ChloraPrep solution. A 1 inch border Ioban drape was placed to isolate this site from the rest of the abdomen. I rescrubbed, changed gown and gloves and we used all new instruments.  An upper abdominal midline incision was made and carried down just below the umbilicus and carried down through subcutaneous tissues. There was edema in the tissues. The midline fascia was incised and peritoneal cavity was opened. Inspection revealed that there was evidence of inflammation in the lower abdomen and you could see where the loops of bowel were extending up to form the abscess boundaries. The loops of small bowel were minimally dilated. It appeared at this time it would not be wise to take these ellipse of small bowel off the abscess cavity as that would contaminate the peritoneal cavity with the abscess.   I determined to do a colostomy as a loop in the left transverse colon and this portion of colon did contain some formed stool and was mobilized dissecting away portions of the omentum. A window was created in the mesentery and an 18-gauge red rubber catheter was passed through this for traction. Next, a site in the left upper quadrant, in the abdominal wall, was selected and a circular incision made and carried down through subcutaneous tissues. A cruciate incision was made in the anterior rectus sheath. The rectus muscle fibers were spread to expose the posterior sheath where another cruciate incision was made. The red rubber  catheter was used to apply traction to pull the bowel up to the skin. Next, the bowel wall was sutured to the deep fascia with 5-0 Dexon interrupted sutures. The red rubber catheter was sutured to the skin with 3-0 nylon. The bowel  appeared to have no tension.   Next, attention was turned to close the midline incision. The fascia was closed with interrupted 0 Maxon figure-of-eight sutures. The skin was closed with clips placing these far enough apart to allow drainage. Dressings were applied and also a towel placed over the wound. Next, the colostomy was matured by making a transverse incision in the antimesenteric border of the bowel and the bowel was then sutured to the skin edges with interrupted 5-0 Vicryl sutures. A portion of the skin was incised adjacent to the red rubber catheter. Benzoin was applied to the skin and a colostomy bag attached.   Following this the drapes were removed and the site of the incision and drainage of the lower abdominal abscess was further inspected. It was cleaned, benzoin applied, and a colostomy bag applied to this.   The patient appeared to be in satisfactory condition and subsequently the central venous catheter was inserted. A rolled towel was placed behind the shoulder blades. The neck was extended with the head turned 20 degrees to the left. The jugular vein was identified with ultrasound. Next, a site was prepared with ChloraPrep and used all new instruments, sterile gown and gloves, and draped in a sterile manner with a full body drape. Next, ultrasound was placed into a sterile sleeve and further identified the jugular vein which appeared normal. Carotid artery was identified as well. The skin overlying the jugular vein was lanced with an 11 blade. Next, with ultrasound guidance, the needle was inserted into the jugular vein, aspirated blood, and inserted a guidewire. An ultrasonic image was saved for the paper chart. The needle was withdrawn. The dilator was advanced over the guidewire and withdrawn. The triple-lumen catheter was advanced over the guidewire and the guidewire was removed. The triple-lumen catheter was advanced in some 14 cm and was sutured to the skin with the accompanying  appliance and silk suture. The surrounding skin was dressed with benzoin and dressed with Tegaderm. It is noted that all the ports were attached to needleless caps and all wounds were flushed and it is noted there was good blood return as well.   The patient appeared to tolerate the procedure satisfactorily. The anesthesiologist felt that she would need to be ventilated overnight as there was concern about postoperative respiratory distress and so arrangements will be made to carry the patient to the critical care unit for ventilator management. ____________________________ J. Renda RollsWilton Xzaria Teo, MD [email or web address]:[id number] D: 02/13/2012 21:47:08 ET T: 02/14/2012 09:33:45 ET JOB#: [id number]  cc: Adella HareJ. Shannon Erastus Bartolomei, MD, <Dictator> Adella HareWILTON J Cuca Benassi MD ELECTRONICALLY SIGNED 02/15/2012 12:47

## 2014-12-06 NOTE — Consult Note (Signed)
PATIENT NAME:  Shannon Willis, Shannon Willis MR#:  [id number] DATE OF BIRTH:  09-Feb-1946  DATE OF CONSULTATION:  02/12/2012  REFERRING PHYSICIAN:   CONSULTING PHYSICIAN:  Adella Hare, MD  HISTORY OF PRESENT ILLNESS: This 69 year old female came into the hospital today with a chief complaint of passing foul-smelling feculent material from her bladder. History was taken from the chart, the patient and her two daughters. She reports that she has been having diarrhea for about four weeks and also has had decreased appetite, feeling weak and tired. Has recently had difficulty with her urine with urinary discomfort and has become aware of passing some feculent material with her urine and also has had feculent material within her vagina. Urinary frequency is increased and also has some dysuria.   She was admitted emergently and has had CT scan which demonstrated a large abscess in the lower abdomen adjacent to her bladder. She has also had Urology consultation by Dr. Evelene Croon who found that she had feculent material in her vagina and urine.   PAST MEDICAL HISTORY:  1. She has no specific history of heart disease other than mitral prolapse.  2. History of asthma.  3. Hypertension.  4. Migraine headaches.  5. Depression and anxiety.  6. Degenerative joint disease.  7. History of multiple urinary infections.  8. History gastroesophageal reflux with ulcers.  9. History of irritable bowel syndrome.   PAST SURGICAL HISTORY: 1. Did have colonoscopy approximately five years ago and did have findings of diverticulosis.  2. Has had previous hysterectomy. 3. Cholecystectomy.  4. Bunion repair.  5. Had recent fracture left tibia which was treated with walking cast.   SOCIAL HISTORY: She does not smoke. Does not drink any alcohol.   FAMILY HISTORY: Mother had heart disease. Father had chronic obstructive pulmonary disease.   MEDICATIONS:  1. Ventolin inhaler 2 puffs 4 times a day. 2. Flovent 220 mcg 2 puffs 2  times per day. 3. Flomax 0.4 mg daily.  4. Sulindac 200 mg 2 times per day. 5. Omeprazole 40 mg daily.  6. Nortriptyline 50 mg 2 tablets at night. 7. Nortriptyline 50 mg 2 tablets at bedtime. 8. Nitrofurantoin 1 capsule daily. 9. Losartan 50 mg daily.  10. Hydrochlorothiazide 25 mg daily.  11. Chlorzoxazone 500 mg 2 tablets twice a day. 12. Xanax 0.5 mg at bedtime, 0.25 mg twice a day during the day.   ALLERGIES: Penicillin and sulfa causing skin rash.  REVIEW OF SYSTEMS: She reports no chills or fever. Has been very tired. She reports no recent changes in vision or difficulties hearing. Has had no recent acute illness such as cough, cold, or sore throat. No chest pain. No dyspnea. Has had very minimal abdominal discomfort. Has vomited immediately after her surgical evaluation. She reports no recent sores or boils. No ankle edema. Review of systems otherwise negative.   PHYSICAL EXAMINATION:   GENERAL: She is awake and slightly drowsy but oriented and conversant.   VITAL SIGNS: Temperature 98.1, pulse 98, respirations 20, blood pressure 111/53, oxygen saturation 95%.   SKIN: Warm and dry without rash, although there is a slight erythema of the anterior neck.   HEENT: Pupils equal and reactive to light. Extraocular movements are intact. Sclerae slightly jaundiced. Pharynx clear.   NECK: No palpable mass.   LUNGS: Lung sounds are clear.   HEART: Regular rhythm, S1 and S2 without murmur.   ABDOMEN: Very obese. There is a large tender mass in the suprapubic area. This mass may be some  15 cm in dimension and is immobile. The upper abdomen is soft and nontender and otherwise appears to be obese but does not appear to be grossly distended.   Perineal exam reveals there is feces within the vagina and also some urine came out during exam with additional feculent appearance of her urine.   RECTAL: Rectal exam demonstrated some firmness anteriorly. Sphincter tone was intact.   EXTREMITIES:  No dependent edema.   NEUROLOGIC: Awake, alert, oriented, moving all extremities.   Clinical data was reviewed. BUN 30, creatinine 1.65, sodium 124. Liver panel with a low albumin of 1.9, total bilirubin 2.3, alkaline phosphatase 235. White blood count 25,400, hemoglobin 8.4, MCV of 79, platelet count 732,000.   I reviewed her CT images demonstrating a very large lower abdominal abscess containing a large amount of feces and gas adjacent to the bladder and also adjacent to the sigmoid colon. The colon distal to this appears to be normal size and proximal to that does appear to contain some feces and gas. Small bowel loops appear to be moderately dilated.   IMPRESSION:  1. Diverticulitis with a large lower abdominal abscess with colovesical fistula.  2. Anemia.  3. Partial small bowel obstruction. 4. Jaundice.  5. Morbid obesity.  6. Hypoalbuminemia.   RECOMMENDATIONS: I recommended a laparotomy, creation of a diverting colostomy, and also drainage of abdominal abscess.   I also discussed the potential need for transfusion. Will plan to type and crossmatch. I've discussed risks and benefits of blood transfusion and risk of transfusion reaction and infections. Will broaden her antibiotic spectrum.   I anticipate doing her surgery tomorrow and will post it for tomorrow.   ____________________________ Shela CommonsJ. Renda RollsWilton Jezreel Justiniano, MD [email or web address] D: 02/12/2012 21:10:35 ET T: 02/13/2012 05:55:47 ET JOB#: [id number]  cc: Adella HareJ. Wilton Cleatis Fandrich, MD, <Dictator> Adella HareWILTON J Danta Baumgardner MD ELECTRONICALLY SIGNED 02/14/2012 19:59

## 2014-12-06 NOTE — Consult Note (Signed)
Patient seen, chart review, films reviewed, note dictated Colovesical fistula, abdominal abscess, acute renal insufficiency The course of events is unfortunate in this situation.  The initial imaging was entirely consistent with colovesical fistula.  Stool and air was present within the bladder.  It would have been most appropriate for urological intervention at the same setting as the diverting colostomy.  An attempt at fistula repair at the same setting would have been preferred.  No Foley catheter has been placed since her admission.  All of the urine has been draining through the abdomen.  A Foley catheter has been placed.  A large amount of stool, debris, and clot was evacuated from the bladder.  The subsequent drainage was only mildly blood tinged.  There is a very high probability of developing a chronic fistula without the initial repair.  We have elected to place the catheter to chest tube suction drainage to try and facilitate as much drainage through the catheter as possible versus the abdominal site and Penrose drain.  If any surgical intervention is undertaken in the near future, and attempted bladder repair would then be recommended.  There is a very small chance that this could heal spontaneously.  I suspect the defect is significant.  There is little else to do at this point other than let her recover from the initial event.  This will be a long course of recovery and additional treatment over time.  I will continue to follow with you and make further recommendations as indicated.  Electronic Signatures: Smith Robertope, Elina Streng S (MD)  (Signed on 03-Jul-13 19:56)  Authored  Last Updated: 03-Jul-13 19:56 by Smith Robertope, Evynn Boutelle S (MD)

## 2014-12-06 NOTE — Consult Note (Signed)
Course beeing followed.good UOP, significant clearing.output noted.foley to chest tube suction for about 1-2 days more.then change over to gravity drainage.need CT cystogram sometime early next week to access status of fistula.other changes for now.  Electronic Signatures: Smith Robertope, Maezie Justin S (MD)  (Signed on 09-Jul-13 07:49)  Authored  Last Updated: 09-Jul-13 07:49 by Smith Robertope, Mayeli Bornhorst S (MD)

## 2014-12-06 NOTE — Consult Note (Signed)
PATIENT NAME:  Shannon Willis, Shannon Willis MR#:  [id number] DATE OF BIRTH:  [birth year]  DATE OF CONSULTATION:  02/14/2012  REFERRING PHYSICIAN:   CONSULTING PHYSICIAN:  Madolyn Frieze. Achilles Dunk, MD  HISTORY OF PRESENT ILLNESS: Mrs. Arciga is a 69 year old white female who presented on July 1st with abdominal pain and discomfort. She had been passing feculent material from her bladder and vaginal region. A urological consultation was requested at that time. No follow through recommendations were made. The patient underwent subsequent general surgery evaluation and was taken to the Operating Room on July 2nd for drainage of a large abdominal abscess and transverse diverting colostomy. No urological evaluation or intervention was undertaken at that time. She has not had a Foley catheter in place as best I can tell since admission. An attempt was made in the Emergency Room with feculent material return. It appears at present that the urine output is draining through the abdominal compartment. No urologic attempt at fistula closure at the time of surgical intervention was undertaken. This is the standard recommendation if possible. A urological consultation was requested for further care and intervention. A Foley catheter was subsequently placed with irrigation of a large amount of stool, clot, and debris from the bladder. Subsequent return was only mild blood tinged. A complete review of films and records was undertaken.   PAST MEDICAL HISTORY:  1. Diverticulosis. 2. Irritable bowel syndrome.  3. Gastroesophageal reflux.  4. Gastric ulcer disease. 5. Chronic urinary tract infections. 6. Cystocele. 7. Rectocele. 8. Asthma. 9. Hypertension. 10. Migraine headaches. 11. Depression. 12. Degenerative joint disease.   PAST SURGICAL HISTORY: Significant for hysterectomy, cholecystectomy, right foot bunion surgery.   SOCIAL HISTORY: Noncontributory for significant alcohol, tobacco, or drug use.   ALLERGIES: Penicillin and  sulfa.   MEDICATIONS ON ADMISSION:  1. Ventolin inhaler 2 puffs 4 times daily. 2. Flovent 220 mcg 2 puffs twice daily.  3. Flomax 0.4 mg daily. 4. Sulindac 200 mg twice daily.  5. Omeprazole 40 mg daily.  6. Nortriptyline 50 mg 2 tablets at bedtime.  7. Nitrofurantoin 1 tablet daily.  8. Losartan 50 mg once daily. 9. Hydrochlorothiazide 25 mg daily.  10. Chlorzoxazone 500 mg 2 tablets daily.  11. Xanax 0.5 mg at night and 0.25 mg twice daily as needed.    PHYSICAL EXAMINATION:  GENERAL: The patient is currently on a ventilator and sedated. Questioning is therefore not possible. History and information is obtained from the patient's chart and nurse.   VITAL SIGNS: Blood pressure 116/36, respiratory rate on ventilator at 16, heart rate of 92.   HEENT: Endotracheal tube in place. Otherwise within normal limits.   CHEST: Mildly decreased breath sounds bilaterally.   CARDIOVASCULAR: Regular rate and rhythm.   ABDOMEN: Obese, protuberant with a midline incision presents. A Penrose drain is present in the inferior portion with a urine collection to bag over the site.  EXTREMITIES: Within normal limits. Movement cannot be assessed due to the patient's sedation.   NEUROLOGIC: The current neurological status can also not be accessed due to the sedation.    ASSESSMENT:  1. Colovesical fistula with abdominal abscess.  2. Acute renal failure.  3. Hematuria.   RECOMMENDATION: Placement of the Foley catheter as stated previously is strongly recommended to allow drainage of the urine from the bladder and not through the abdominal Penrose site. She is at significant risk of developing a chronic fistula. The ideal would have been attempt at repair at the time of surgical intervention from the bladder  standpoint. Placement of the Foley catheter to low chest tube suction may help with facilitating bladder drainage. There is a small chance of spontaneous closure from the bladder standpoint. I  suspect there is a sizable defect based on the CT findings. If any surgical intervention is undertaken in the near future, an attempt at bladder repair would strongly be recommended. I do not feel that returning to surgery at this point would be overall beneficial. The bladder can be irrigated as necessary. We will follow with you and make further recommendations as indicated. This will likely require a prolonged healing phase and subsequent surgical correction of the bladder fistula.  ____________________________ Madolyn FriezeBrian S. Achilles Dunkope, MD bsc:rbg D: 02/14/2012 20:36:12 ET T: 02/16/2012 10:09:59 ET JOB#: [id number]  cc: Madolyn FriezeBrian S. Achilles Dunkope, MD, <Dictator> Madolyn FriezeBRIAN S Zanasia Hickson MD ELECTRONICALLY SIGNED 02/20/2012 23:57

## 2014-12-06 NOTE — H&P (Signed)
PATIENT NAME:  Shannon Willis, Shannon Willis MR#:  [id number] DATE OF BIRTH:  Jan 02, 1946  DATE OF ADMISSION:  02/12/2012  PRIMARY CARE PHYSICIAN: Conchita Parison Chaplin, MD  CHIEF COMPLAINT: Diarrhea and fatigue.   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 The patient is reporting that for the last 1-1/2 months she has diarrhea described as liquid diarrhea, about 5 times a day without melena or hematochezia. Gradually she got weaker. Additionally, two weeks ago, she also lost her husband and her appetite is down and she is not eating or drinking much. When her symptoms worsened, the family brought her to the Emergency Department for evaluation. The patient also indicates that she has chronic return urinary tract infections, however, in the last few days, there is increase in the urinary frequency and dysuria. Evaluation here in the Emergency Department revealed multitude of problems. There is evidence of renal failure, severe hyponatremia, evidence of dehydration, anemia, and slight elevation of bilirubin. Obtaining urine sample was unsuccessful as there is fecal material in the sample. Therefore, we are now attempting to have straight catheterization in and out. On the other hand, this could be secondary to her rectovesical fistula.   REVIEW OF SYSTEMS: CONSTITUTIONAL: She is not clear whether she had fever or not. She looks lethargic and sleepy and barely able to get adequate history. She indicates she has some chills. She has fatigue. EYES: No blurring of vision. No double vision. ENT: No hearing impairment. No sore throat. No dysphagia. CARDIOVASCULAR: No chest pain. No shortness of breath. No syncope. RESPIRATORY: No cough. No sputum production. No hemoptysis. No chest pain. No shortness of breath.  GASTROINTESTINAL: No abdominal pain. No vomiting but reports diarrhea. No hematochezia. No melena. GENITOURINARY: Reports dysuria and  frequency of urination. MUSCULOSKELETAL: No joint pain or swelling. No muscular pain or swelling. INTEGUMENTARY: No skin rash. No ulcers. NEUROLOGY: No focal weakness. No seizure activity. No headache. PSYCHIATRY: She has depression and history of anxiety. ENDOCRINE: No polyuria or polydipsia, other than the few days of frequency of urination. No heat or cold intolerance.    PAST MEDICAL HISTORY:  1. Irritable bowel syndrome. 2. Gastroesophageal reflux ulcer disease. 3. Recurrent urinary tract infections. 4. Cystocele. 5. Rectocele. 6. Rectovesical fistula.  7. Asthma. 8. Hypertension. 9. Migraine. 10. Depression and anxiety. 11. Degenerative joint disease.   PAST SURGICAL HISTORY:  1. Hysterectomy.  2. Cholecystectomy.  3. Right foot bunion surgery.  SOCIAL HABITS: Nonsmoker. No history of alcohol abuse.   SOCIAL HISTORY: She is now widowed and lives at home alone. She is visited by her daughters.  FAMILY HISTORY: Her mother died at age of 69 and she suffered from congestive heart failure. Her father died from complications of chronic obstructive pulmonary disease.   ADMISSION MEDICATIONS:  1. Ventolin inhaler 2 puffs four times daily. 2. Flovent 220 mcg 2 puffs twice a day. 3. Flomax 0.4 mg once a day. 4. Sulindac 200 mg twice a day. 5. Omeprazole 40 mg once a day. 6. Nortriptyline 50 mg 2 tablets at night. 7. Nitrofurantoin (Macrobid) 1 capsule once a day. 8. Losartan 50 mg once a day.  9. Hydrochlorothiazide 25 mg a day. 10. Chlorzoxazone 500 mg 2 tablets twice a day.  11. Alprazolam (Xanax) 0.5 mg at night and 0.25 mg twice a day during the day.   ALLERGIES: Penicillin and sulfa  causing skin rash.   PHYSICAL EXAMINATION:   VITAL SIGNS: Blood pressure 124/45, respiratory rate 20, pulse 94, temperature 98.6, and oxygen saturation 98%.    GENERAL APPEARANCE: Elderly lady, feeble, slightly sleepy and lethargic. She is lying in bed in no acute distress.   HEAD AND NECK: Examination was remarkable for pallor. No icterus. No cyanosis.   EAR, NOSE, AND THROAT: Hearing was normal. Nasal mucosa, lips, and tongue were normal. She is edentulous and has dentures in.   EYES: Normal eyelids and conjunctiva, although the conjunctivae are pale. Pupils are about 5 mm, equal and reactive to light.   NECK: Supple. Trachea at midline. No thyromegaly. No cervical lymphadenopathy. No masses.   HEART: Normal S1 and S2. No S3 or S4. There is grade 1/6 early systolic murmur at the apex. No carotid bruits.   RESPIRATORY: Normal breathing pattern without use of accessory muscles. No rales. No wheezing.   ABDOMEN: Soft and obese without tenderness, only upon deep palpation. No rebound. No rigidity. No hepatosplenomegaly. No masses. No hernia, although she reported a ventral hernia on the left side, but during exam today, while she is supine, it is not evident.   MUSCULOSKELETAL: No joint swelling. No clubbing.   SKIN: No ulcers. No subcutaneous nodules.   NEUROLOGIC: Cranial nerves II through XII are intact. No focal motor deficit.   PSYCHIATRY: The patient is alert and oriented to place and people. Mood and affect were flat.   LABORATORY, DIAGNOSTIC AND RADIOLOGIC DATA: Chest x-ray showed no consolidation, no effusion.   EKG showed normal sinus rhythm at rate of 92 per minute, incomplete right bundle branch block, poor progression of R waves in the anterior chest leads, Q waves in leads III and aVF, otherwise unremarkable EKG.  Serum glucose 108, BUN 30, and creatinine 1.65. GFR is estimated to be 32. Serum sodium was low at 124, potassium 3.6, and chloride 91. Calcium 8.2, albumin low at 1.9, bilirubin elevated at 2.3, alkaline phosphatase 235, and AST and ALT were normal. Troponin normal at less than 0.02. CBC showed white count of 25,000,  hemoglobin 8.4, hematocrit 26, platelet count 732, MCV low at 79, MCH and MCHC were low also, 25 and 31, respectively.   ASSESSMENT:  1. Diarrhea x6 weeks associated with fatigue. 2. Symptoms of urinary tract infection, awaiting urine sample for analysis.  3. Acute renal failure, likely secondary to prerenal azotemia.  4. Anemia with hemoglobin of 8; this is microcytic hypochromic anemia.  5. Hyponatremia. 6. Dehydration. 7. Elevated bilirubin, at 2.3. 8. Low albumin at 1.9.  9. Leukocytosis or elevated white blood cell count reaching 25,000. 10. Thrombocytosis.  11. Her other medical problems as stated above; history of irritable bowel syndrome, gastroesophageal reflux disease, asthma, hypertension, migraine, depression, anxiety, degenerative joint disease, rectocele and cystocele, and rectovesical fistula.   PLAN: Admit the patient and start IV hydration with normal saline. Blood cultures x2. Stool for culture, C. difficile, and ova and parasites. Stool for Hemoccult. Urine for urinalysis and culture. Empiric IV antibiotic using ciprofloxacin and Flagyl will be initiated. Repeat basic metabolic profile tomorrow to follow up on the kidney function and sodium. Also will repeat tomorrow hepatic panel. I will continue her home medications as listed above, however, due to her clinical medical problems, I will hold the nonsteroidal antiinflammatory medication, sulindac. I will also hold losartan and hydrochlorothiazide. Also I will hold the nitrofurantoin and the muscle     relaxant, chlorzoxazone. I also ordered anemia work-up to check  her serum iron, TIBC, ferritin, B12, and folate. I asked about a Living Will. The patient has no Living Will nor has she appointed anybody to have the power of attorney.   TIME SPENT EVALUATING THIS PATIENT: More than one hour.  ____________________________ Carney Corners. Rudene Re, MD amd:slb D: 02/12/2012 02:15:02 ET T: 02/12/2012 07:38:53 ET JOB#: [id number]  cc: Carney Corners. Rudene Re, MD, <Dictator> Jimmie Molly. Candelaria Stagers, MD Zollie Scale MD ELECTRONICALLY SIGNED 02/12/2012 22:22

## 2014-12-06 NOTE — Consult Note (Signed)
PATIENT NAME:  Shannon Willis, PASCH MR#:  [id number] DATE OF BIRTH:  23-Nov-1945  DATE OF CONSULTATION:  02/19/2012  REFERRING PHYSICIAN:   CONSULTING PHYSICIAN:  Marcina Millard, MD  PRIMARY CARE PHYSICIAN:  Dr. Candelaria Stagers   CHIEF COMPLAINT:  Diarrhea.  HISTORY OF PRESENT ILLNESS: The patient is a 69 year old female who was admitted on 02/12/2012 with diarrhea, dehydration, and acute renal failure. The patient was found to have a colovesical fistula and intraabdominal abscess and underwent laparotomy, colostomy, and drainage of the abscess by Dr. Renda Rolls on 02/13/2012.  The patient has been seen by Dr. Assunta Gambles. The patient is currently on a Foley catheter. The patient required prolonged intubation, vasopressor support for hypotension, and has been followed by Dr. Belia Heman. The patient did have an episode of brief supraventricular tachycardia which converted to junctional rhythm on intravenous Cardizem. The patient was extubated yesterday and this morning developed fluid overload, pulmonary edema, and congestive heart failure which responded to furosemide 40 mg IV with diuresis of 2100 mL. The patient has been in sinus tachycardia, rates of 110-120 bpm. The patient is currently not taking any oral medications due to ileus.   PAST MEDICAL HISTORY:  1. Mitral valve prolapse.  2. Hypertension. 3. Irritable bowel syndrome.  4. Gastroesophageal reflux disease.  5. Recurrent urinary tract infections. 6. Rectovesical fistula. 7. Asthma. 8. Migraines.   MEDICATIONS ON ADMISSION:  1. Losartan 50 mg daily.  2. HCTZ 25 mg daily.  3. Ventolin inhaler 2 puffs q.i.d.  4. Flovent 220 mcg 2 puffs b.i.d.  5. Flomax 0.4 mg daily.  6. Sulindac 200 mg b.i.d.  7. Omeprazole 400 mg daily.  8. Nortriptyline 50 mg, 2 tablets at bedtime.  9. Macrobid 1 capsule daily.  10. Chlorzoxazone 500 mg, 2 tabs b.i.d. 11. Xanax 0.5 mg at bedtime.   SOCIAL HISTORY: The patient is a widow. She currently lives alone.  She denies tobacco abuse.   FAMILY HISTORY: No immediate family history of myocardial infarction or coronary artery disease.   REVIEW OF SYSTEMS:  CONSTITUTIONAL: No fever or chills. The patient has been lethargic. EYES: No blurry vision. EARS: No hearing loss. RESPIRATORY: No shortness of breath prior to admission. CARDIOVASCULAR: No chest pain. GASTROINTESTINAL: No nausea or vomiting, but the patient has had chronic diarrhea for six weeks. GU: The patient has had dysuria and frequency. MUSCULOSKELETAL: No arthralgias or myalgias. NEUROLOGICAL: No focal muscle weakness or numbness.  PSYCHOLOGICAL: No depression or anxiety.   PHYSICAL EXAMINATION:  VITAL SIGNS: Blood pressure 157/61, pulse 128, respirations 32, temperature 103.1, pulse oximetry 91%.   HEENT: Pupils equal, reactive to light and accommodation.   NECK: Supple without thyromegaly.   LUNGS: Decreased breath sounds in both bases.   HEART: Normal jugular venous pressure. Normal point of maximal impulse. Regular rate and rhythm. No appreciable gallop, murmur, or rub.   ABDOMEN: Protuberant with diminished bowel sounds.   EXTREMITIES: There was no cyanosis or clubbing. There was trace to 1+ bilateral pedal edema.   MUSCULOSKELETAL: Normal muscle tone.   NEUROLOGIC: The patient is alert, currently intubated and sedated. Motor and sensory are grossly intact.   IMPRESSION: 69 year old female who presents with a rectovesical fistula, probable underlying sepsis with abdominal abscess, status post colostomy and laparoscopic surgery who had prolonged intubation, now extubated. The patient had fluid retention with pulmonary edema earlier today with prompt diuresis with Lasix. The patient currently is in sinus tachycardia which is likely multifactorial. The patient is also febrile.   RECOMMENDATIONS:  1. Agree with overall current therapy.  2. Would repeat furosemide 40 mg at  4:30.  3. Consider repeat 2-D echocardiogram.  4. Following  diuresis if the patient remains tachycardic in sinus tachycardia and is symptomatic, then would consider beta blocker. The patient would require intermittent intravenous beta blocker since the patient is not taking oral medications. On the other hand, if patient develops supraventricular tachycardia or atrial fibrillation, then would treat with diltiazem bolus and drip.  5. Further recommendations pending echocardiogram result.    ____________________________ Marcina MillardAlexander Lilianah Buffin, MD ap:bjt D: 02/19/2012 13:24:20 ET T: 02/19/2012 15:35:26 ET JOB#: [id number]  cc: Marcina MillardAlexander Melodie Ashworth, MD, <Dictator> Marcina MillardALEXANDER Fumi Guadron MD ELECTRONICALLY SIGNED 02/22/2012 8:38

## 2014-12-06 NOTE — Consult Note (Signed)
  General Aspect lack of needed IV access    Present Illness patient now with increasing SOB, intermittantly alert and then lethargic, c/o abd pain using accessory muscles to breathe, on amiodarone infusion, off vasopressors. patient critically ill and prognosis guarded. patient at risk for cardio-pulm arrest.  She is requiring multiple parenteral medications and in spite of a TLC she does not have enough lumens.  I am asked to evaluate and secure adequate IV access.   Home Medications:  Medication Instructions Status  hydrochlorothiazide 25 mg oral tablet 1 tab(s) orally once a day Active  alprazolam 0.25 mg oral tablet 1 tab(s) orally 2 times a day (morning & midday) Active  alprazolam 0.5 mg oral tablet 1 tab(s) orally once a day (at bedtime) Active  chlorzoxazone 500 mg oral tablet 2 tab(s) orally 2 times a day Active  sulindac 200 mg oral tablet 1 tab(s) orally 2 times a day Active  Flovent HFA 220 mcg/inh inhalation aerosol 2 puff(s) inhaled 2 times a day Active  tamsulosin 0.4 mg oral capsule 1 cap(s) orally once a day (30min after eating evening meal) Active  Ventolin HFA 90 mcg/inh inhalation aerosol 2 puff(s) inhaled 4 times a day Active  omeprazole 40 mg oral delayed release capsule 1 cap(s) orally once a day Active  losartan 50 mg oral tablet 1 tab(s) orally once a day Active    Penicillin: Rash  Sulfa drugs: Rash  Case History:   Family History Non-Contributory    Review of Systems:   ROS Pt not able to provide ROS    Physical Exam:   GEN obese, critically ill appearing    HEENT dry oral mucosa, poor dentition, Nasal bipap    NECK trachea midline  No visible masses     RESP postive use of accessory muscles  wheezing     CARD regular rate  LE edema present     ABD obese nondistneded     EXTR negative cyanosis/clubbing, positive edema    SKIN No rashes, No ulcers    PSYCH poor insight, sedated   Nursing/Ancillary Notes:  **Vital Signs.:   09-Jul-13 09:00    Vital Signs Type Routine   Temperature Temperature (F) 98   Celsius 36.6   Temperature Source rectal   Pulse Pulse 72   Respirations Respirations 21   Systolic BP Systolic BP 103   Diastolic BP (mmHg) Diastolic BP (mmHg) 43   Mean BP 63   Pulse Ox % Pulse Ox % 100   Pulse Ox Activity Level  At rest   Oxygen Delivery 35% hfnc   Pulse Ox Heart Rate 72     Hepatic:  09-Jul-13 03:23    Albumin, Serum  1.7 (Result(s) reported on 20 Feb 2012 at 03:51AM.)  Routine Chem:  09-Jul-13 03:23    Potassium, Serum 3.6   Potassium, Serum 3.6 (Result(s) reported on 20 Feb 2012 at 03:51AM.)   Glucose, Serum  101   BUN  31   Creatinine (comp) 0.87   Sodium, Serum 142   Chloride, Serum 105   CO2, Serum 26   Anion Gap 11   Osmolality (calc) 290   eGFR (African American) >60   eGFR (Non-African American) >60 (eGFR values <60mL/min/1.73 m2 may be an indication of chronic kidney disease (CKD). Calculated eGFR is useful in patients with stable renal function. The eGFR calculation will not be reliable in acutely ill patients when serum creatinine is changing rapidly. It is not useful in  patients on dialysis.   The eGFR calculation may not be applicable to patients at the low and high extremes of body sizes, pregnant women, and vegetarians.)   Magnesium, Serum 2.2 (1.8-2.4 THERAPEUTIC RANGE: 4-7 mg/dL TOXIC: > 10 mg/dL  -----------------------)   Phosphorus, Serum 2.7 (Result(s) reported on 20 Feb 2012 at 03:51AM.)   Calcium (Total), Serum  7.6   Calcium (Total), Serum  7.4 (Result(s) reported on 20 Feb 2012 at 03:51AM.)     XRay:    01-Jul-13 01:07, Chest Portable Single View   Chest Portable Single View    REASON FOR EXAM:    WEAKNESS  COMMENTS:       PROCEDURE: DXR - DXR PORTABLE CHEST SINGLE VIEW  - Feb 12 2012  1:07AM     RESULT: Cardiac monitoring electrodes are present. The projection is   lordotic.    The lungs are clear. The heart and pulmonary vessels are normal. The bony    and mediastinal structures are unremarkable. There is no effusion. There   is no pneumothorax or evidence of congestive failure.    IMPRESSION:  No acute cardiopulmonary disease.    Dictation Site: 2    Verified By: GEOFFREY H. BROWNE, M.D., MD     Impression 1.Lack of IV access -patient is requiring atleast three more ports.  I have discussed this situation with Dr Kasa extensively.  We do not have triple lumen PIC lines available but the PIC line service does.  Because of these unusual circumstantes I recommend that the PIC service place a triple lumen.  Dr Kasa agrees  2.shock-from sepsis with cardiogenic -continue vasopressors to keep MAP>65 if needed -follow up blood cultures -hold all antihypertensives and beta blockers for now  3.s/p abd surgery s/p colostomy-now enterocutaneus fistula -follow up gen surgery recs -follow up urology recs  4.severe malnurishment -Started TPN  5.febrile episodes -will need to consider re-imaging to assess for recurrent abscess. -will discuss with PMD and surgery  6.CHF -unable to provide beta blocker and ace due to low blood pressure -follow up cardiology recs -continue amiadarone infusion.   7.critical illness neuropathy -avoid heavy sedation -will need PT at some point  8.Anxiety -continue precedex as needed    Plan level 3   Electronic Signatures: Schnier, Gregory (MD)  (Signed 10-Jul-13 15:40)  Authored: General Aspect/Present Illness, Home Medications, Allergies, History and Physical Exam, Vital Signs, Labs, Radiology, Impression/Plan   Last Updated: 10-Jul-13 15:40 by Schnier, Gregory (MD) 

## 2014-12-06 NOTE — Consult Note (Signed)
Weekend course reviewed.  plan on CT cystogram tomorrow.if would benefit from full Ct at the same setting?place order for tomorrowmake further recs pending cystogram  Electronic Signatures: Smith Robertope, Adley Mazurowski S (MD)  (Signed on 15-Jul-13 08:27)  Authored  Last Updated: 15-Jul-13 08:27 by Smith Robertope, Kaeo Jacome S (MD)
